# Patient Record
Sex: Female | Born: 1960 | Race: White | Hispanic: No | Marital: Married | Smoking: Former smoker
Health system: Southern US, Community
[De-identification: ages and names within clinical notes are randomized; demographics above are authoritative.]

## PROBLEM LIST (undated history)

## (undated) DIAGNOSIS — E785 Hyperlipidemia, unspecified: Secondary | ICD-10-CM

## (undated) DIAGNOSIS — F419 Anxiety disorder, unspecified: Secondary | ICD-10-CM

## (undated) DIAGNOSIS — R7303 Prediabetes: Secondary | ICD-10-CM

## (undated) DIAGNOSIS — M8580 Other specified disorders of bone density and structure, unspecified site: Secondary | ICD-10-CM

## (undated) DIAGNOSIS — C801 Malignant (primary) neoplasm, unspecified: Secondary | ICD-10-CM

## (undated) DIAGNOSIS — T7840XA Allergy, unspecified, initial encounter: Secondary | ICD-10-CM

## (undated) HISTORY — PX: SPLENECTOMY, TOTAL: SHX788

## (undated) HISTORY — DX: Allergy, unspecified, initial encounter: T78.40XA

## (undated) HISTORY — DX: Anxiety disorder, unspecified: F41.9

## (undated) HISTORY — PX: CHOLECYSTECTOMY: SHX55

## (undated) HISTORY — DX: Hyperlipidemia, unspecified: E78.5

## (undated) HISTORY — DX: Malignant (primary) neoplasm, unspecified: C80.1

## (undated) HISTORY — DX: Other specified disorders of bone density and structure, unspecified site: M85.80

---

## 1989-08-19 HISTORY — PX: ABDOMINAL HYSTERECTOMY: SHX81

## 1995-08-20 HISTORY — PX: OTHER SURGICAL HISTORY: SHX169

## 2006-03-31 ENCOUNTER — Ambulatory Visit: Payer: Self-pay | Admitting: Family Medicine

## 2011-06-19 ENCOUNTER — Emergency Department: Payer: Self-pay | Admitting: Emergency Medicine

## 2012-04-03 DIAGNOSIS — Q8901 Asplenia (congenital): Secondary | ICD-10-CM | POA: Insufficient documentation

## 2012-04-03 DIAGNOSIS — K635 Polyp of colon: Secondary | ICD-10-CM | POA: Insufficient documentation

## 2014-03-16 DIAGNOSIS — D473 Essential (hemorrhagic) thrombocythemia: Secondary | ICD-10-CM | POA: Insufficient documentation

## 2014-03-16 DIAGNOSIS — D75839 Thrombocytosis, unspecified: Secondary | ICD-10-CM | POA: Insufficient documentation

## 2014-10-20 ENCOUNTER — Ambulatory Visit: Payer: Self-pay | Admitting: Unknown Physician Specialty

## 2014-10-21 ENCOUNTER — Other Ambulatory Visit: Payer: Self-pay | Admitting: Unknown Physician Specialty

## 2014-10-21 DIAGNOSIS — R1032 Left lower quadrant pain: Secondary | ICD-10-CM | POA: Insufficient documentation

## 2016-03-28 DIAGNOSIS — D473 Essential (hemorrhagic) thrombocythemia: Secondary | ICD-10-CM | POA: Diagnosis not present

## 2016-03-28 DIAGNOSIS — E7800 Pure hypercholesterolemia, unspecified: Secondary | ICD-10-CM | POA: Diagnosis not present

## 2016-03-28 DIAGNOSIS — R739 Hyperglycemia, unspecified: Secondary | ICD-10-CM | POA: Diagnosis not present

## 2016-04-04 DIAGNOSIS — D473 Essential (hemorrhagic) thrombocythemia: Secondary | ICD-10-CM | POA: Diagnosis not present

## 2016-04-04 DIAGNOSIS — Z9081 Acquired absence of spleen: Secondary | ICD-10-CM | POA: Diagnosis not present

## 2016-04-04 DIAGNOSIS — E7800 Pure hypercholesterolemia, unspecified: Secondary | ICD-10-CM | POA: Diagnosis not present

## 2016-04-04 DIAGNOSIS — F410 Panic disorder [episodic paroxysmal anxiety] without agoraphobia: Secondary | ICD-10-CM | POA: Diagnosis not present

## 2016-10-01 DIAGNOSIS — R739 Hyperglycemia, unspecified: Secondary | ICD-10-CM | POA: Diagnosis not present

## 2016-10-01 DIAGNOSIS — R7989 Other specified abnormal findings of blood chemistry: Secondary | ICD-10-CM | POA: Diagnosis not present

## 2016-10-14 ENCOUNTER — Other Ambulatory Visit: Payer: Self-pay | Admitting: Nurse Practitioner

## 2016-10-14 DIAGNOSIS — R1032 Left lower quadrant pain: Secondary | ICD-10-CM

## 2016-10-14 DIAGNOSIS — G8929 Other chronic pain: Secondary | ICD-10-CM | POA: Diagnosis not present

## 2016-10-14 DIAGNOSIS — R109 Unspecified abdominal pain: Secondary | ICD-10-CM

## 2016-10-17 ENCOUNTER — Ambulatory Visit: Payer: Self-pay

## 2016-10-21 DIAGNOSIS — J9801 Acute bronchospasm: Secondary | ICD-10-CM | POA: Diagnosis not present

## 2016-10-21 DIAGNOSIS — R05 Cough: Secondary | ICD-10-CM | POA: Diagnosis not present

## 2016-10-23 ENCOUNTER — Ambulatory Visit: Payer: Self-pay

## 2016-10-29 ENCOUNTER — Encounter: Payer: Self-pay | Admitting: Radiology

## 2016-10-29 ENCOUNTER — Ambulatory Visit
Admission: RE | Admit: 2016-10-29 | Discharge: 2016-10-29 | Disposition: A | Discharge status: Home / Self Care | Payer: BLUE CROSS/BLUE SHIELD | Source: Ambulatory Visit | Attending: Nurse Practitioner | Admitting: Nurse Practitioner

## 2016-10-29 DIAGNOSIS — R1032 Left lower quadrant pain: Secondary | ICD-10-CM | POA: Insufficient documentation

## 2016-10-29 DIAGNOSIS — G8929 Other chronic pain: Secondary | ICD-10-CM

## 2016-10-29 DIAGNOSIS — R590 Localized enlarged lymph nodes: Secondary | ICD-10-CM | POA: Diagnosis not present

## 2016-10-29 DIAGNOSIS — R109 Unspecified abdominal pain: Secondary | ICD-10-CM

## 2016-10-29 MED ORDER — IOPAMIDOL (ISOVUE-300) INJECTION 61%
100.0000 mL | Freq: Once | INTRAVENOUS | Status: AC | PRN
  Administered 2016-10-29: 100 mL via INTRAVENOUS

## 2016-11-05 DIAGNOSIS — E7800 Pure hypercholesterolemia, unspecified: Secondary | ICD-10-CM | POA: Diagnosis not present

## 2016-11-05 DIAGNOSIS — D473 Essential (hemorrhagic) thrombocythemia: Secondary | ICD-10-CM | POA: Diagnosis not present

## 2016-11-05 DIAGNOSIS — Z1231 Encounter for screening mammogram for malignant neoplasm of breast: Secondary | ICD-10-CM | POA: Diagnosis not present

## 2016-11-05 DIAGNOSIS — Z0000 Encounter for general adult medical examination without abnormal findings: Secondary | ICD-10-CM | POA: Diagnosis not present

## 2016-11-05 DIAGNOSIS — Z720 Tobacco use: Secondary | ICD-10-CM | POA: Insufficient documentation

## 2016-11-08 ENCOUNTER — Other Ambulatory Visit: Payer: Self-pay | Admitting: Family Medicine

## 2016-11-08 DIAGNOSIS — M8580 Other specified disorders of bone density and structure, unspecified site: Secondary | ICD-10-CM

## 2016-11-13 DIAGNOSIS — G8929 Other chronic pain: Secondary | ICD-10-CM | POA: Diagnosis not present

## 2016-11-13 DIAGNOSIS — R1032 Left lower quadrant pain: Secondary | ICD-10-CM | POA: Diagnosis not present

## 2016-11-27 DIAGNOSIS — Z1231 Encounter for screening mammogram for malignant neoplasm of breast: Secondary | ICD-10-CM | POA: Diagnosis not present

## 2016-12-11 DIAGNOSIS — R920 Mammographic microcalcification found on diagnostic imaging of breast: Secondary | ICD-10-CM | POA: Diagnosis not present

## 2016-12-11 DIAGNOSIS — R928 Other abnormal and inconclusive findings on diagnostic imaging of breast: Secondary | ICD-10-CM | POA: Diagnosis not present

## 2017-01-16 DIAGNOSIS — R1032 Left lower quadrant pain: Secondary | ICD-10-CM | POA: Diagnosis not present

## 2017-03-27 ENCOUNTER — Encounter: Payer: Self-pay | Admitting: Nurse Practitioner

## 2017-03-27 ENCOUNTER — Ambulatory Visit (INDEPENDENT_AMBULATORY_CARE_PROVIDER_SITE_OTHER): Payer: BLUE CROSS/BLUE SHIELD | Admitting: Nurse Practitioner

## 2017-03-27 VITALS — BP 104/57 | HR 70 | Temp 98.5°F | Ht 68.0 in | Wt 187.6 lb

## 2017-03-27 DIAGNOSIS — Z716 Tobacco abuse counseling: Secondary | ICD-10-CM | POA: Diagnosis not present

## 2017-03-27 DIAGNOSIS — E785 Hyperlipidemia, unspecified: Secondary | ICD-10-CM | POA: Diagnosis not present

## 2017-03-27 DIAGNOSIS — F410 Panic disorder [episodic paroxysmal anxiety] without agoraphobia: Secondary | ICD-10-CM | POA: Diagnosis not present

## 2017-03-27 DIAGNOSIS — J3089 Other allergic rhinitis: Secondary | ICD-10-CM | POA: Insufficient documentation

## 2017-03-27 DIAGNOSIS — F17200 Nicotine dependence, unspecified, uncomplicated: Secondary | ICD-10-CM

## 2017-03-27 DIAGNOSIS — M8580 Other specified disorders of bone density and structure, unspecified site: Secondary | ICD-10-CM

## 2017-03-27 DIAGNOSIS — Z720 Tobacco use: Secondary | ICD-10-CM | POA: Diagnosis not present

## 2017-03-27 DIAGNOSIS — M810 Age-related osteoporosis without current pathological fracture: Secondary | ICD-10-CM

## 2017-03-27 DIAGNOSIS — Z13820 Encounter for screening for osteoporosis: Secondary | ICD-10-CM

## 2017-03-27 DIAGNOSIS — Z9109 Other allergy status, other than to drugs and biological substances: Secondary | ICD-10-CM

## 2017-03-27 HISTORY — DX: Other specified disorders of bone density and structure, unspecified site: M85.80

## 2017-03-27 MED ORDER — CETIRIZINE HCL 10 MG PO TABS: 10.0000 mg | ORAL_TABLET | Freq: Every day | ORAL | 11 refills | Status: DC

## 2017-03-27 MED ORDER — FLUTICASONE PROPIONATE 50 MCG/ACT NA SUSP: 2.0000 | Freq: Every day | NASAL | 11 refills | Status: DC

## 2017-03-27 MED ORDER — ATORVASTATIN CALCIUM 20 MG PO TABS: 20.0000 mg | ORAL_TABLET | Freq: Every day | ORAL | 11 refills | Status: DC

## 2017-03-27 MED ORDER — SERTRALINE HCL 50 MG PO TABS: 50.0000 mg | ORAL_TABLET | Freq: Every day | ORAL | 11 refills | Status: DC

## 2017-03-27 MED ORDER — MONTELUKAST SODIUM 10 MG PO TABS: 10.0000 mg | ORAL_TABLET | Freq: Every day | ORAL | 11 refills | Status: DC

## 2017-03-27 NOTE — Patient Instructions (Addendum)
Kelsey Key, Thank you for coming in to clinic today.  1. You have several stable medical conditions.  YOur medications have been refilled.  Unless you have changes in symptoms, we will see each other in 1 year.  2. Your annual physical on or after 11/05/2017  3. When you are ready to stop smoking, let me know.  4. You have osteopenia.  This means you do have some small loss of bone density associated with being postmenopausal.  This does not require treatment with medications at this time, but there are some things you should do to prevent further breakdown of bone. If you have progression of osteopenia to osteoporosis, we would recommend medications.  We will repeat your bone density scan now to assess how quickly you are losing bone density.  - Weight bearing exercise: use weights for arm exercises and regular exercise like walking is recommended.  Activating your muscles creates tension on your bones and promotes bone health. - START taking calcium 500-600 mg with vitamin D supplement 2-3 times daily with meals.  If you are eating dairy, only take the supplement two times daily at your meals with least dairy consumption.  Aim for a total of 1500 mg calcium daily with supplements and dietary sources.  Your body only absorbs 500-600 mg each time you take calcium, so make sure you separate your doses.   Please schedule a follow-up appointment with Cassell Smiles, AGNP. Return in about 7 months (around 11/05/2017) for annual physical and in 1 year for hyperlipidemia, anxiety, and seasonal allergies.  If you have any other questions or concerns, please feel free to call the clinic or send a message through [place]. You may also schedule an earlier appointment if necessary.  You will receive a survey after today's visit either digitally by e-mail or paper by [employer]. Your experiences and feedback matter to [place].  Please respond so we know how we are doing as we provide care for you.   Cassell Smiles,  DNP, AGNP-BC Adult Gerontology Nurse Practitioner [place]

## 2017-03-27 NOTE — Progress Notes (Signed)
Subjective:    Patient ID: Kelsey Key, female    DOB: Sep 10, 1960, 56 y.o.   MRN: [id number]  Kelsey Key is a 56 y.o. female presenting on 03/27/2017 for [facility] [place] Provider Pt last seen by PCP Dr. Astrid Divine at [facility] about 6 months ago.  Obtain records from [place].   Pt also needs medicines refilled for her hyperlipidemia, allergies, anxiety.  Hyperlipidemia Pt eats regular diet, avoids fried foods.   Is taking atorvastatin and fish oil 2,000 mg once daily.  Has had lipid panel checked about 6 months ago w/ LDL and total cholesterol at goal.  Environmental and Seasonal Allergies Pt takes Zyrtec, Singulair, Flonase year-round and still has periods of worsening congestion.  Spring and Summer are worst seasons. Ragweed pollens are significant trigger.  Last sinus infection was in April.  She is currently having postnasal drip and "nasty tasting drainage." has already started using a neti pot and saline spray.  She denies any fevers, body aches.  Is current smoker of 1/2-1 ppd.  Tobacco Use/ Smoking Cessation Pt wants to stop smoking, but does not feel ready to stop smoking.  She notes she has been able to quit in past using Wellbutrin w/ nicotine patches.  She was successful at remaining quit for 1.5 years.  Notes stress and anxiety are triggers to resume smoking.  Unsuccessful with patch alone, Chantix (vivid dreams).  Would consider stopping again in future, but not ready now.  GAD/Panic disorder Has recently had abdominal pain w/ negative workup.  Was encouraged to resume her sertraline at 50 mg once daily in April.  Had previously only taken 25 mg once daily.  Notes no change in abdominal pain or moods.   Pt also has prescription for Xanax 12.5 mg tablet last filled in 2016.  Takes only 1/2 tablet each time and has only taken 10 doses since last fill.  Has only had 1-2 major panic attacks in last year, but has days w/ less intense panic.   Pt uses Xanax on those days especially if she has trouble sleeping.  Osteopenia Pt w/ early menopause 2/2 complete hysterectomy. Last DEXA scan performed at [facility] on [address] more than 5 years ago.  She has not been taking calcium or vitamin D supplements. She gets occasional exercise w/ active lifestyle outside of work w/ gardening, housework, and occasional walking.    Past Medical History:  Diagnosis Date  . Anxiety   . Hyperlipidemia    Past Surgical History:  Procedure Laterality Date  . ABDOMINAL HYSTERECTOMY    . CHOLECYSTECTOMY    . SPLENECTOMY, TOTAL     Social History   Social History  . Marital status: Married    Spouse name: N/A  . Number of children: N/A  . Years of education: N/A   Occupational History  . Not on file.   Social History Main Topics  . Smoking status: Current Every Day Smoker    Packs/day: 1.00    Years: 30.00    Types: Cigarettes  . Smokeless tobacco: Never Used  . Alcohol use Not on file  . Drug use: No  . Sexual activity: Not on file   Other Topics Concern  . Not on file   Social History Narrative  . No narrative on file   Family History  Problem Relation Age of Onset  . Diabetes Mother   . Diabetes Father   . Hypertension Brother   .  Throat cancer Maternal Grandmother   . Healthy Brother   . Heart attack Neg Hx   . Stroke Neg Hx   . Breast cancer Neg Hx   . Colon cancer Neg Hx   . Ovarian cancer Neg Hx    No current outpatient prescriptions on file prior to visit.   No current facility-administered medications on file prior to visit.     Review of Systems  Constitutional: Negative.   HENT: Positive for sinus pain and sinus pressure.   Eyes: Negative.   Respiratory: Negative.   Cardiovascular: Negative.   Gastrointestinal: Positive for abdominal pain. Negative for constipation and diarrhea.  Endocrine: Negative.   Genitourinary: Negative.   Musculoskeletal: Negative.   Skin: Negative.     Allergic/Immunologic: Positive for environmental allergies.  Neurological: Negative.   Hematological: Negative.   Psychiatric/Behavioral: Positive for sleep disturbance. Negative for suicidal ideas. The patient is nervous/anxious.    Per HPI unless specifically indicated above     Objective:    BP (!) 104/57 (BP Location: Right Arm, Patient Position: Sitting, Cuff Size: Normal)   Pulse 70   Temp 98.5 F (36.9 C) (Oral)   Ht 5\' 8"  (1.727 m)   Wt 187 lb 9.6 oz (85.1 kg)   SpO2 97%   BMI 28.52 kg/m   Wt Readings from Last 3 Encounters:  03/27/17 187 lb 9.6 oz (85.1 kg)    Physical Exam  Constitutional: She is oriented to person, place, and time. She appears well-developed and well-nourished. No distress.  HENT:  Head: Normocephalic and atraumatic.  Right Ear: Hearing, tympanic membrane, external ear and ear canal normal.  Left Ear: Hearing, tympanic membrane, external ear and ear canal normal.  Nose: Right sinus exhibits maxillary sinus tenderness and frontal sinus tenderness. Left sinus exhibits maxillary sinus tenderness and frontal sinus tenderness.  Mouth/Throat: Uvula is midline, oropharynx is clear and moist and mucous membranes are normal.  Eyes: Pupils are equal, round, and reactive to light. Conjunctivae and EOM are normal. Right eye exhibits no discharge. Left eye exhibits no discharge.  Neck: Normal range of motion. Neck supple. No JVD present. Carotid bruit is not present.  Cardiovascular: Normal rate, regular rhythm and normal heart sounds.   Pulmonary/Chest: Effort normal and breath sounds normal. No respiratory distress.  Abdominal: Soft. Bowel sounds are normal. She exhibits no distension and no mass. There is no tenderness.  Lymphadenopathy:    She has no cervical adenopathy.  Neurological: She is alert and oriented to person, place, and time.  Skin: Skin is warm and dry.  Psychiatric: She has a normal mood and affect. Her behavior is normal. Judgment and  thought content normal.  Vitals reviewed.     Assessment & Plan:   Problem List Items Addressed This Visit      Musculoskeletal and Integument   Osteopenia    Last bone density in 03/2014 indicates T - 1.9 for spine.  Pt has not been taking any calcium supplements or doing other things to prevent progression.  Pt w/ early menopause w/ complete hysterectomy in 1991.  Plan: 1. Repeat DEXA to evaluate progress.  Pt given external order requisition to return to last location where DEXA was performed [facility] imaging center). 2. Start taking calcium supplementation 500 mg w/ vitamin D 2-3 times daily for total calcium intake from supplement and dietary sources of 1500 mg daily. 3. Follow up as needed after results.        Other   Hyperlipidemia - Primary  Pt stable at last labs about 6 months ago w/ LDL and total cholesterol at goal.  Pt on atorvastatin 20 mg once daily.  Plan: 1. Encouraged heart healthy diet. 2. Continue atorvastatin 20 mg once daily. 3. Repeat labs for lipid at next annual physical. 4. Follow up 6 months.      Relevant Medications   atorvastatin (LIPITOR) 20 MG tablet   Environmental and seasonal allergies    Currently w/ acute exacerbation w/ increased sinus tenderness and pressure.  Pt taking year-round Zyrtec, Singulair, and Flonase without symptoms of epistaxis.  Has recently begun using netipot to assist w/ increased symptoms.  Currently afebrile w/o s/sx of active viral/bacterial infection.  Plan: 1. Encouraged adequate water hydration.  Use guaifenesin as needed OTC. 2. Continue current medication regimen. 3. If becomes febrile in next 5-7 days, call clinic.  May consider antibiotic therapy. 4. Follow up as needed.      Relevant Medications   cetirizine (ZYRTEC) 10 MG tablet   fluticasone (FLONASE) 50 MCG/ACT nasal spray   montelukast (SINGULAIR) 10 MG tablet   Panic disorder    Currently stable.  Panic is situational and pt often avoids these  situations or prepares herself for the stress prior to encountering them.   Plan: 1. Continue sertraline 50 mg once daily. 2. Consider augmenting therapy if panic attacks increase in frequency. 3. Follow up 6 months.      Relevant Medications   sertraline (ZOLOFT) 50 MG tablet    Other Visit Diagnoses    Postmenopausal bone loss     See osteopenia above.   Relevant Orders   DG Bone Density   Osteoporosis screening     See osteopenia above.   Relevant Orders   DG Bone Density   Has been smoking tobacco for 30 years or more     Pt at increased risk for osteoporosis and lung cancer w/ smoking history.  Pt currently declines to start smoking cessation.  Uses tobacco when stressed and doesn't see stress resolving soon.  Plan: 1. Encouraged cessation.  Pt will contact if becomes ready to quit prior to next appointment.    Encounter for smoking cessation counseling     Discussion today >10 minutes specifically on counseling on risks of tobacco use, complications, treatment, smoking cessation including risks of cancer, osteoporosis, managing stress.       Meds ordered this encounter  Medications  . Multiple Vitamin (MULTIVITAMIN) capsule    Sig: Take by mouth.  Marland Kitchen atorvastatin (LIPITOR) 20 MG tablet    Sig: Take 1 tablet (20 mg total) by mouth daily.    Dispense:  30 tablet    Refill:  11    Order Specific Question:   Supervising Provider    Answer:   Olin Hauser [[id number]]  . cetirizine (ZYRTEC) 10 MG tablet    Sig: Take 1 tablet (10 mg total) by mouth daily.    Dispense:  30 tablet    Refill:  11    Order Specific Question:   Supervising Provider    Answer:   Olin Hauser [[id number]]  . fluticasone (FLONASE) 50 MCG/ACT nasal spray    Sig: Place 2 sprays into both nostrils daily.    Dispense:  16 g    Refill:  11    Order Specific Question:   Supervising Provider    Answer:   Olin Hauser [[id number]]  . montelukast (SINGULAIR) 10 MG tablet    Sig:  Take 1 tablet (10 mg  total) by mouth at bedtime.    Dispense:  30 tablet    Refill:  11    Order Specific Question:   Supervising Provider    Answer:   Olin Hauser [[id number]]  . sertraline (ZOLOFT) 50 MG tablet    Sig: Take 1 tablet (50 mg total) by mouth daily.    Dispense:  30 tablet    Refill:  11    Order Specific Question:   Supervising Provider    Answer:   Olin Hauser [[id number]]      Follow up plan: Return in about 7 months (around 11/05/2017) for annual physical and in 1 year for hyperlipidemia, anxiety, and seasonal allergies.  Cassell Smiles, DNP, AGPCNP-BC Adult Gerontology Primary Care Nurse Practitioner [facility] 03/27/2017, 4:49 PM

## 2017-03-28 NOTE — Assessment & Plan Note (Signed)
Currently stable.  Panic is situational and pt often avoids these situations or prepares herself for the stress prior to encountering them.   Plan: 1. Continue sertraline 50 mg once daily. 2. Consider augmenting therapy if panic attacks increase in frequency. 3. Follow up 6 months.

## 2017-03-28 NOTE — Progress Notes (Signed)
I have reviewed this encounter including the documentation in this note and/or discussed this patient with the provider, Cassell Smiles, AGPCNP-BC. I am certifying that I agree with the content of this note as supervising physician.  Nobie Putnam, [facility] 03/28/2017, 12:29 PM

## 2017-03-28 NOTE — Assessment & Plan Note (Signed)
Currently w/ acute exacerbation w/ increased sinus tenderness and pressure.  Pt taking year-round Zyrtec, Singulair, and Flonase without symptoms of epistaxis.  Has recently begun using netipot to assist w/ increased symptoms.  Currently afebrile w/o s/sx of active viral/bacterial infection.  Plan: 1. Encouraged adequate water hydration.  Use guaifenesin as needed OTC. 2. Continue current medication regimen. 3. If becomes febrile in next 5-7 days, call clinic.  May consider antibiotic therapy. 4. Follow up as needed.

## 2017-03-28 NOTE — Assessment & Plan Note (Signed)
Pt stable at last labs about 6 months ago w/ LDL and total cholesterol at goal.  Pt on atorvastatin 20 mg once daily.  Plan: 1. Encouraged heart healthy diet. 2. Continue atorvastatin 20 mg once daily. 3. Repeat labs for lipid at next annual physical. 4. Follow up 6 months.

## 2017-03-28 NOTE — Assessment & Plan Note (Signed)
Last bone density in 03/2014 indicates T - 1.9 for spine.  Pt has not been taking any calcium supplements or doing other things to prevent progression.  Pt w/ early menopause w/ complete hysterectomy in 1991.  Plan: 1. Repeat DEXA to evaluate progress.  Pt given external order requisition to return to last location where DEXA was performed [facility] imaging center). 2. Start taking calcium supplementation 500 mg w/ vitamin D 2-3 times daily for total calcium intake from supplement and dietary sources of 1500 mg daily. 3. Follow up as needed after results.

## 2017-03-31 ENCOUNTER — Encounter: Payer: Self-pay | Admitting: Nurse Practitioner

## 2017-03-31 MED ORDER — AMOXICILLIN 500 MG PO TABS: 500.0000 mg | ORAL_TABLET | Freq: Two times a day (BID) | ORAL | 0 refills | Status: AC

## 2017-05-13 DIAGNOSIS — Z780 Asymptomatic menopausal state: Secondary | ICD-10-CM | POA: Diagnosis not present

## 2017-05-13 DIAGNOSIS — M8580 Other specified disorders of bone density and structure, unspecified site: Secondary | ICD-10-CM | POA: Diagnosis not present

## 2017-05-13 LAB — HM DEXA SCAN: HM Dexa Scan: NORMAL

## 2017-05-15 ENCOUNTER — Telehealth: Payer: Self-pay | Admitting: Nurse Practitioner

## 2017-05-15 ENCOUNTER — Other Ambulatory Visit: Payer: Self-pay

## 2017-05-15 NOTE — Telephone Encounter (Signed)
Received results of DEXA scan. Bone density 03/2017: Spine Z 0.6 T -0.6  (Increase 16.8%) Left femur Z 0.8 T 0  (Increase 1.2%)   Please call pt to tell her she has had improvement in bone density of her spine and stable bone denisty in her left femur.  Encourage continued supplementation of calcium and vitamin D and weight bearing exercise.    - Weight bearing exercise: use weights for arm exercises and regular exercise like walking is recommended.  Activating your muscles creates tension on your bones and promotes bone health.  - START taking calcium 500-600 mg with vitamin D supplement 2-3 times daily with meals.  If you are eating dairy, only take the supplement two times daily at your meals with least dairy consumption.  Aim for a total of 1500 mg calcium daily with supplements and dietary sources.  Your body only absorbs 500-600 mg each time you take calcium, so make sure you separate your doses.

## 2017-05-15 NOTE — Telephone Encounter (Signed)
The pt was notified. No questions or concerns. 

## 2017-06-18 ENCOUNTER — Ambulatory Visit (INDEPENDENT_AMBULATORY_CARE_PROVIDER_SITE_OTHER): Payer: BLUE CROSS/BLUE SHIELD

## 2017-06-18 DIAGNOSIS — Z23 Encounter for immunization: Secondary | ICD-10-CM

## 2017-09-18 DIAGNOSIS — M461 Sacroiliitis, not elsewhere classified: Secondary | ICD-10-CM | POA: Diagnosis not present

## 2017-09-18 DIAGNOSIS — M545 Low back pain: Secondary | ICD-10-CM | POA: Diagnosis not present

## 2017-09-18 DIAGNOSIS — M9904 Segmental and somatic dysfunction of sacral region: Secondary | ICD-10-CM | POA: Diagnosis not present

## 2017-09-18 DIAGNOSIS — M9903 Segmental and somatic dysfunction of lumbar region: Secondary | ICD-10-CM | POA: Diagnosis not present

## 2017-09-19 DIAGNOSIS — M9904 Segmental and somatic dysfunction of sacral region: Secondary | ICD-10-CM | POA: Diagnosis not present

## 2017-09-19 DIAGNOSIS — M9903 Segmental and somatic dysfunction of lumbar region: Secondary | ICD-10-CM | POA: Diagnosis not present

## 2017-09-19 DIAGNOSIS — M461 Sacroiliitis, not elsewhere classified: Secondary | ICD-10-CM | POA: Diagnosis not present

## 2017-09-19 DIAGNOSIS — M545 Low back pain: Secondary | ICD-10-CM | POA: Diagnosis not present

## 2017-09-23 DIAGNOSIS — M545 Low back pain: Secondary | ICD-10-CM | POA: Diagnosis not present

## 2017-09-23 DIAGNOSIS — M9904 Segmental and somatic dysfunction of sacral region: Secondary | ICD-10-CM | POA: Diagnosis not present

## 2017-09-23 DIAGNOSIS — M9903 Segmental and somatic dysfunction of lumbar region: Secondary | ICD-10-CM | POA: Diagnosis not present

## 2017-09-23 DIAGNOSIS — M461 Sacroiliitis, not elsewhere classified: Secondary | ICD-10-CM | POA: Diagnosis not present

## 2017-09-25 DIAGNOSIS — M9903 Segmental and somatic dysfunction of lumbar region: Secondary | ICD-10-CM | POA: Diagnosis not present

## 2017-09-25 DIAGNOSIS — M545 Low back pain: Secondary | ICD-10-CM | POA: Diagnosis not present

## 2017-09-25 DIAGNOSIS — M9904 Segmental and somatic dysfunction of sacral region: Secondary | ICD-10-CM | POA: Diagnosis not present

## 2017-09-25 DIAGNOSIS — M461 Sacroiliitis, not elsewhere classified: Secondary | ICD-10-CM | POA: Diagnosis not present

## 2017-12-24 ENCOUNTER — Other Ambulatory Visit: Payer: Self-pay

## 2017-12-24 DIAGNOSIS — Z9109 Other allergy status, other than to drugs and biological substances: Secondary | ICD-10-CM

## 2017-12-24 MED ORDER — MONTELUKAST SODIUM 10 MG PO TABS: 10.0000 mg | ORAL_TABLET | Freq: Every day | ORAL | 0 refills | Status: DC

## 2017-12-24 NOTE — Telephone Encounter (Signed)
Refill sent to [facility]

## 2017-12-30 ENCOUNTER — Other Ambulatory Visit: Payer: Self-pay

## 2017-12-30 DIAGNOSIS — E785 Hyperlipidemia, unspecified: Secondary | ICD-10-CM

## 2017-12-30 DIAGNOSIS — Z9109 Other allergy status, other than to drugs and biological substances: Secondary | ICD-10-CM

## 2017-12-30 MED ORDER — CETIRIZINE HCL 10 MG PO TABS: 10.0000 mg | ORAL_TABLET | Freq: Every day | ORAL | 0 refills | Status: DC

## 2017-12-30 MED ORDER — ATORVASTATIN CALCIUM 20 MG PO TABS: 20.0000 mg | ORAL_TABLET | Freq: Every day | ORAL | 0 refills | Status: DC

## 2018-02-23 ENCOUNTER — Other Ambulatory Visit: Payer: Self-pay | Admitting: Nurse Practitioner

## 2018-02-23 DIAGNOSIS — F410 Panic disorder [episodic paroxysmal anxiety] without agoraphobia: Secondary | ICD-10-CM

## 2018-02-25 ENCOUNTER — Telehealth: Payer: Self-pay | Admitting: Nurse Practitioner

## 2018-02-25 NOTE — Telephone Encounter (Signed)
Pt will be in Aug 1st for a follow up for refills.  She asked if she needed to do labs prior to appt [phone]

## 2018-02-25 NOTE — Telephone Encounter (Signed)
Patient advised to be seen first the last visit was 03/2017.

## 2018-02-26 ENCOUNTER — Other Ambulatory Visit: Payer: Self-pay | Admitting: Nurse Practitioner

## 2018-02-26 DIAGNOSIS — F410 Panic disorder [episodic paroxysmal anxiety] without agoraphobia: Secondary | ICD-10-CM

## 2018-03-10 ENCOUNTER — Encounter: Payer: Self-pay | Admitting: Nurse Practitioner

## 2018-03-10 ENCOUNTER — Ambulatory Visit (INDEPENDENT_AMBULATORY_CARE_PROVIDER_SITE_OTHER): Payer: BLUE CROSS/BLUE SHIELD | Admitting: Nurse Practitioner

## 2018-03-10 ENCOUNTER — Other Ambulatory Visit: Payer: Self-pay

## 2018-03-10 VITALS — BP 118/57 | HR 81 | Temp 98.5°F | Ht 68.0 in | Wt 188.2 lb

## 2018-03-10 DIAGNOSIS — L237 Allergic contact dermatitis due to plants, except food: Secondary | ICD-10-CM | POA: Diagnosis not present

## 2018-03-10 MED ORDER — TRIAMCINOLONE ACETONIDE 0.1 % EX CREA: 1.0000 "application " | TOPICAL_CREAM | Freq: Two times a day (BID) | CUTANEOUS | 0 refills | Status: AC

## 2018-03-10 MED ORDER — PREDNISONE 10 MG PO TABS: ORAL_TABLET | ORAL | 0 refills | Status: DC

## 2018-03-10 NOTE — Patient Instructions (Addendum)
Kelsey Key,   Thank you for coming in to clinic today.  1. Start triamcinolone ointment 0.1% twice daily for 14 days on your arms and legs. Use for no more than 7 days on your face.  2. Take prednisone taper 10 mg tablets Day 1 (Today): Take 6 pills at one time Day 2: Take 6 pills  Day 3: Take 5 pills Day 4: Take 4 pills Day 5: Take 3 pills Day 6: Take 2 pills Day 7: Take 1 pill then stop.   3. Watch for cellulitis - spreading red rash, weeping of liquid from your skin, or any white/cloudy pink drainage from your wounds.  Please schedule a follow-up appointment with Cassell Smiles, AGNP. Return 5-7 days if symptoms worsen or fail to improve.  If you have any other questions or concerns, please feel free to call the clinic or send a message through [place]. You may also schedule an earlier appointment if necessary.  You will receive a survey after today's visit either digitally by e-mail or paper by [employer]. Your experiences and feedback matter to [place].  Please respond so we know how we are doing as we provide care for you.   Cassell Smiles, DNP, AGNP-BC Adult Gerontology Nurse Practitioner [place]

## 2018-03-10 NOTE — Progress Notes (Signed)
Subjective:    Patient ID: Kelsey Key, female    DOB: 1961/03/06, 57 y.o.   MRN: [id number]  Kelsey Key is a 57 y.o. 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  Possible poison ivy/oak/sumac exposure.  Also possible for this to be insects or reaction to pesticide in [place]. - Pustular rash began about 4-5 days ago and continues to spread.  Now on R leg, bilateral forearms, and face.  It is intensely pruritic.  Patient is awakening at night 2/2 symptoms.  Has been using cortisone 10 with mild relief. - Patient is concerned for skin infection with asplenia.  Has been using some Neosporin to prevent.  Social History   Tobacco Use  . Smoking status: Current Every Day Smoker    Packs/day: 1.00    Years: 30.00    Pack years: 30.00    Types: Cigarettes  . Smokeless tobacco: Never Used  Substance Use Topics  . Alcohol use: No  . Drug use: No    Review of Systems Per HPI unless specifically indicated above     Objective:    BP (!) 118/57 (BP Location: Left Arm, Patient Position: Sitting, Cuff Size: Normal)   Pulse 81   Temp 98.5 F (36.9 C) (Oral)   Ht 5\' 8"  (1.727 m)   Wt 188 lb 3.2 oz (85.4 kg)   BMI 28.62 kg/m   Wt Readings from Last 3 Encounters:  03/10/18 188 lb 3.2 oz (85.4 kg)  03/27/17 187 lb 9.6 oz (85.1 kg)    Physical Exam  Constitutional: She is oriented to person, place, and time. She appears well-developed and well-nourished. No distress.  HENT:  Head: Normocephalic and atraumatic.  Neurological: She is alert and oriented to person, place, and time.  Skin: Skin is warm and dry. Capillary refill takes less than 2 seconds. Rash (diffuse rash over anterior forearms bilaterally, R lateral leg and thigh, and R cheek - nodular and pustular rash with raised erythematous and edematous lesions.  Several marked with excoriation and  scabs.) noted.  Psychiatric: She has a normal mood and affect. Her behavior is normal. Judgment and thought content normal.  Vitals reviewed.   Results for orders placed or performed in visit on 05/15/17  HM DEXA SCAN  Result Value Ref Range   HM Dexa Scan normal       Assessment & Plan:   Problem List Items Addressed This Visit    None    Visit Diagnoses    Contact dermatitis due to poison ivy    -  Primary   Relevant Medications   predniSONE (DELTASONE) 10 MG tablet   triamcinolone cream (KENALOG) 0.1 %    Consistent with poison ivy contact dermatitis, x 4 days duration. Patient with known h/o poison ivy, similar with previous exposures - Afebrile, no evidence of bacterial superinfection from scratching  Plan: 1. Start prednisone taper over 7 days Day 1-2: 60 mg, Day 3: 50 mg, Day 4: 40 mg; Day 5: 30 mg; Day 6 20 mg; Day 7: 10 mg then stop. 2. Start OTC Benadryl PRN itching, may try topical calamine lotion. - START triamcinolone 0.1% bid x 7-14 days prn itching. 3. Avoid scratching 4. Return criteria given, 1- 2 weeks if worsening or not improving.  Monitor for cellulitis.  Education provided about skin infection  signs and symptoms.    Meds ordered this encounter  Medications  . predniSONE (DELTASONE) 10 MG tablet    Sig: Day 1-2 take 6 pills. Day 3 take 5 pills then reduce by 1 pill each day.    Dispense:  27 tablet    Refill:  0    Order Specific Question:   Supervising Provider    Answer:   Olin Hauser [[id number]]  . triamcinolone cream (KENALOG) 0.1 %    Sig: Apply 1 application topically 2 (two) times daily for 14 days.    Dispense:  28.4 g    Refill:  0    Order Specific Question:   Supervising Provider    Answer:   Olin Hauser [[id number]]    Follow up plan: Return 5-7 days if symptoms worsen or fail to improve.  Cassell Smiles, DNP, AGPCNP-BC Adult Gerontology Primary Care Nurse Practitioner [facility] 03/10/2018, 9:33 AM

## 2018-03-19 ENCOUNTER — Encounter: Payer: Self-pay | Admitting: Nurse Practitioner

## 2018-03-19 ENCOUNTER — Ambulatory Visit (INDEPENDENT_AMBULATORY_CARE_PROVIDER_SITE_OTHER): Payer: BLUE CROSS/BLUE SHIELD | Admitting: Nurse Practitioner

## 2018-03-19 ENCOUNTER — Other Ambulatory Visit: Payer: Self-pay

## 2018-03-19 VITALS — BP 109/52 | HR 76 | Temp 98.4°F | Ht 68.0 in | Wt 187.2 lb

## 2018-03-19 DIAGNOSIS — F410 Panic disorder [episodic paroxysmal anxiety] without agoraphobia: Secondary | ICD-10-CM | POA: Diagnosis not present

## 2018-03-19 DIAGNOSIS — E785 Hyperlipidemia, unspecified: Secondary | ICD-10-CM

## 2018-03-19 DIAGNOSIS — Z130 Encounter for screening for diseases of the blood and blood-forming organs and certain disorders involving the immune mechanism: Secondary | ICD-10-CM

## 2018-03-19 DIAGNOSIS — Z9109 Other allergy status, other than to drugs and biological substances: Secondary | ICD-10-CM

## 2018-03-19 DIAGNOSIS — F418 Other specified anxiety disorders: Secondary | ICD-10-CM | POA: Diagnosis not present

## 2018-03-19 MED ORDER — FLUTICASONE PROPIONATE 50 MCG/ACT NA SUSP: 2.0000 | Freq: Every day | NASAL | 11 refills | Status: DC

## 2018-03-19 MED ORDER — ATORVASTATIN CALCIUM 20 MG PO TABS: 20.0000 mg | ORAL_TABLET | Freq: Every day | ORAL | 0 refills | Status: DC

## 2018-03-19 MED ORDER — ALPRAZOLAM 0.25 MG PO TABS: 0.2500 mg | ORAL_TABLET | Freq: Two times a day (BID) | ORAL | 0 refills | Status: DC | PRN

## 2018-03-19 MED ORDER — SERTRALINE HCL 50 MG PO TABS: 50.0000 mg | ORAL_TABLET | Freq: Every day | ORAL | 1 refills | Status: DC

## 2018-03-19 NOTE — Progress Notes (Signed)
Subjective:    Patient ID: Kelsey Key, female    DOB: Nov 07, 1960, 57 y.o.   MRN: [id number]  Kelsey Key is a 57 y.o. female presenting on 03/19/2018 for Medication Refill and Rash (recently diagnose w/ poison ivy)   HPI Anxiety/panic disorder Patient is currently taking Zoloft 50 mg once daily.  She also takes alprazolam 0.25 mg up to twice daily as needed for anxiety.  She has only used 4 tablets out of 10 tablets over the last 6 months.  PMP aware reviewed and is consistent with patient report.  Currently, patient feels anxiety is well controlled.  Panic attacks occur very infrequently and are usually associated with flying in airplane.  Patient does report difficulty managing day-to-day stress.  Hyperlipidemia Patient continues to take atorvastatin 20 mg once daily with good relief.  She has had some dietary indiscretions over recent weeks.  No new ASCVD events. Pt denies changes in vision, chest tightness/pressure, palpitations, shortness of breath, leg pain while walking, leg or arm weakness, and sudden loss of speech or loss of consciousness.   Rash Poison ivy rash has returned with low severity over last 2-3 days.  Patient had improvement on prednisone.  Majority of the rash has resolved.  Patient continues to use triamcinolone ointment twice daily  Social History   Tobacco Use  . Smoking status: Current Every Day Smoker    Packs/day: 1.00    Years: 30.00    Pack years: 30.00    Types: Cigarettes  . Smokeless tobacco: Never Used  Substance Use Topics  . Alcohol use: No  . Drug use: No    Review of Systems Per HPI unless specifically indicated above     Objective:    BP (!) 109/52 (BP Location: Left Arm, Patient Position: Sitting, Cuff Size: Normal)   Pulse 76   Temp 98.4 F (36.9 C) (Oral)   Ht 5\' 8"  (1.727 m)   Wt 187 lb 3.2 oz (84.9 kg)   BMI 28.46 kg/m   Wt Readings from Last 3 Encounters:  03/19/18 187 lb 3.2 oz (84.9 kg)  03/10/18 188 lb 3.2 oz (85.4  kg)  03/27/17 187 lb 9.6 oz (85.1 kg)    Physical Exam  Constitutional: She is oriented to person, place, and time. She appears well-developed and well-nourished. No distress.  HENT:  Head: Normocephalic and atraumatic.  Neck: Normal range of motion. Neck supple. Carotid bruit is not present.  Cardiovascular: Normal rate, regular rhythm, S1 normal, S2 normal, normal heart sounds and intact distal pulses.  Pulmonary/Chest: Effort normal and breath sounds normal. No respiratory distress.  Abdominal: Soft. Bowel sounds are normal.  Musculoskeletal: She exhibits no edema (pedal).  Neurological: She is alert and oriented to person, place, and time.  Skin: Skin is warm and dry. Capillary refill takes less than 2 seconds.  Diffuse pustular rash over arms and legs significantly improved from last exam  Psychiatric: She has a normal mood and affect. Her behavior is normal. Judgment and thought content normal.  Vitals reviewed.    Results for orders placed or performed in visit on 05/15/17  HM DEXA SCAN  Result Value Ref Range   HM Dexa Scan normal       Assessment & Plan:   Problem List Items Addressed This Visit      Other   Hyperlipidemia    Pt stable at last labs about 6 months ago w/ LDL and total cholesterol at goal.  Pt on atorvastatin 20  mg once daily.  Plan: 1. Encouraged heart healthy diet. 2. Continue atorvastatin 20 mg once daily. 3. Repeat lipid panel today. 4. Follow up 6 months.      Relevant Medications   atorvastatin (LIPITOR) 20 MG tablet   Other Relevant Orders   Lipid panel   TSH   Comprehensive metabolic panel   Panic disorder    Currently stable.  Panic is situational and pt often avoids these situations or prepares herself for the stress prior to encountering them.  Anxiety is not 100% controlled.  Patient often has difficulty self managing with situational anxiety.  Plan: 1. Continue sertraline 50 mg once daily. 2. Consider augmenting therapy if panic  attacks increase in frequency. 3.  Instructed patient on mindfulness techniques.  Consider counseling therapy in future. 4. Follow up 6 months.      Relevant Medications   sertraline (ZOLOFT) 50 MG tablet   ALPRAZolam (XANAX) 0.25 MG tablet   Situational anxiety - Primary    See assessment plan panic disorder      Relevant Medications   sertraline (ZOLOFT) 50 MG tablet   ALPRAZolam (XANAX) 0.25 MG tablet    Other Visit Diagnoses    Environmental allergies       Stable on Flonase.  Refill prescription today.  Follow-up as needed if symptoms worsen.   Relevant Medications   fluticasone (FLONASE) 50 MCG/ACT nasal spray   Screening for deficiency anemia       Patient with past history of deficiency anemia.  Rescreen with labs today.   Relevant Orders   CBC with Differential/Platelet      Meds ordered this encounter  Medications  . sertraline (ZOLOFT) 50 MG tablet    Sig: Take 1 tablet (50 mg total) by mouth daily.    Dispense:  30 tablet    Refill:  1  . atorvastatin (LIPITOR) 20 MG tablet    Sig: Take 1 tablet (20 mg total) by mouth daily.    Dispense:  30 tablet    Refill:  0  . fluticasone (FLONASE) 50 MCG/ACT nasal spray    Sig: Place 2 sprays into both nostrils daily.    Dispense:  16 g    Refill:  11  . ALPRAZolam (XANAX) 0.25 MG tablet    Sig: Take 1 tablet (0.25 mg total) by mouth 2 (two) times daily as needed for anxiety.    Dispense:  10 tablet    Refill:  0    Order Specific Question:   Supervising Provider    Answer:   Olin Hauser [[id number]]    Follow up plan: Return in about 6 months (around 09/19/2018) for Anxiety, cholesterol AND 3 months for physical.  Cassell Smiles, DNP, AGPCNP-BC Adult Gerontology Primary Care Nurse Practitioner [facility] 03/23/2018, 5:55 PM

## 2018-03-19 NOTE — Patient Instructions (Addendum)
Kelsey Key,   Thank you for coming in to clinic today.  1. Continue alprazolam 0.25 mg one tablet as needed for anxiety with flying.  2. You will be due for FASTING BLOOD WORK.  This means you should eat no food or drink after midnight.  Drink only water or coffee without cream/sugar on the morning of your lab visit. - Please go ahead have lab draw at [place] in the next 7 days. - Your results will be available about 2-3 days after blood draw.  If you have set up a MyChart account, you can can log in to MyChart online to view your results and a brief explanation. Also, we can discuss your results together at your next office visit if you would like. Labs at   3. Continue triamcinolone for your poison ivy rash until cleared.  4. Continue Zoloft 50 mg once daily  5. Consider mindfulness for stress management. - FOCUS on what is important right now. - Can I change it? - Can I fix it right now? Or later (maybe write this down on a list or your email/voicemail)? - Is it something that someone else should do? - let go of what you cannot control.  - schedule communication for later if it needs to be addressed.  - 4-7-8 breathing technique: breathe in to count of 4, hold breath for count of 7, exhale for count of 8; do 3-5 times for letting go of overactive thoughts.   Please schedule a follow-up appointment with Cassell Smiles, AGNP. Return in about 6 months (around 09/19/2018) for Anxiety, cholesterol AND 3 months for physical.  If you have any other questions or concerns, please feel free to call the clinic or send a message through [place]. You may also schedule an earlier appointment if necessary.  You will receive a survey after today's visit either digitally by e-mail or paper by [employer]. Your experiences and feedback matter to [place].  Please respond so we know how we are doing as we provide care for you.   Cassell Smiles, DNP, AGNP-BC Adult Gerontology Nurse Practitioner [place]

## 2018-03-22 ENCOUNTER — Encounter: Payer: Self-pay | Admitting: Nurse Practitioner

## 2018-03-23 ENCOUNTER — Encounter: Payer: Self-pay | Admitting: Nurse Practitioner

## 2018-03-23 DIAGNOSIS — F418 Other specified anxiety disorders: Secondary | ICD-10-CM | POA: Insufficient documentation

## 2018-03-23 NOTE — Assessment & Plan Note (Signed)
Currently stable.  Panic is situational and pt often avoids these situations or prepares herself for the stress prior to encountering them.  Anxiety is not 100% controlled.  Patient often has difficulty self managing with situational anxiety.  Plan: 1. Continue sertraline 50 mg once daily. 2. Consider augmenting therapy if panic attacks increase in frequency. 3.  Instructed patient on mindfulness techniques.  Consider counseling therapy in future. 4. Follow up 6 months.

## 2018-03-23 NOTE — Assessment & Plan Note (Signed)
See assessment plan panic disorder

## 2018-03-23 NOTE — Assessment & Plan Note (Signed)
Pt stable at last labs about 6 months ago w/ LDL and total cholesterol at goal.  Pt on atorvastatin 20 mg once daily.  Plan: 1. Encouraged heart healthy diet. 2. Continue atorvastatin 20 mg once daily. 3. Repeat lipid panel today. 4. Follow up 6 months.

## 2018-03-25 ENCOUNTER — Other Ambulatory Visit: Payer: Self-pay

## 2018-03-25 DIAGNOSIS — E785 Hyperlipidemia, unspecified: Secondary | ICD-10-CM | POA: Diagnosis not present

## 2018-03-25 DIAGNOSIS — Z9109 Other allergy status, other than to drugs and biological substances: Secondary | ICD-10-CM

## 2018-03-25 DIAGNOSIS — Z130 Encounter for screening for diseases of the blood and blood-forming organs and certain disorders involving the immune mechanism: Secondary | ICD-10-CM | POA: Diagnosis not present

## 2018-03-25 MED ORDER — MONTELUKAST SODIUM 10 MG PO TABS: 10.0000 mg | ORAL_TABLET | Freq: Every day | ORAL | 0 refills | Status: DC

## 2018-03-25 MED ORDER — CETIRIZINE HCL 10 MG PO TABS: 10.0000 mg | ORAL_TABLET | Freq: Every day | ORAL | 0 refills | Status: DC

## 2018-03-26 ENCOUNTER — Other Ambulatory Visit: Payer: Self-pay | Admitting: Nurse Practitioner

## 2018-03-26 DIAGNOSIS — E785 Hyperlipidemia, unspecified: Secondary | ICD-10-CM

## 2018-03-26 LAB — CBC WITH DIFFERENTIAL/PLATELET
Basophils Absolute: 0.1 10*3/uL (ref 0.0–0.2)
Basos: 1 %
EOS (ABSOLUTE): 0.1 10*3/uL (ref 0.0–0.4)
Eos: 1 %
Hematocrit: 39.7 % (ref 34.0–46.6)
Hemoglobin: 13.6 g/dL (ref 11.1–15.9)
Immature Grans (Abs): 0 10*3/uL (ref 0.0–0.1)
Immature Granulocytes: 0 %
Lymphocytes Absolute: 3.1 10*3/uL (ref 0.7–3.1)
Lymphs: 29 %
MCH: 32 pg (ref 26.6–33.0)
MCHC: 34.3 g/dL (ref 31.5–35.7)
MCV: 93 fL (ref 79–97)
Monocytes Absolute: 0.8 10*3/uL (ref 0.1–0.9)
Monocytes: 8 %
Neutrophils Absolute: 6.8 10*3/uL (ref 1.4–7.0)
Neutrophils: 61 %
Platelets: 411 10*3/uL (ref 150–450)
RBC: 4.25 x10E6/uL (ref 3.77–5.28)
RDW: 13.8 % (ref 12.3–15.4)
WBC: 10.9 10*3/uL — ABNORMAL HIGH (ref 3.4–10.8)

## 2018-03-26 LAB — COMPREHENSIVE METABOLIC PANEL
ALT: 14 IU/L (ref 0–32)
AST: 12 IU/L (ref 0–40)
Albumin/Globulin Ratio: 2.1 (ref 1.2–2.2)
Albumin: 4.2 g/dL (ref 3.5–5.5)
Alkaline Phosphatase: 50 IU/L (ref 39–117)
BUN/Creatinine Ratio: 12 (ref 9–23)
BUN: 11 mg/dL (ref 6–24)
Bilirubin Total: 0.4 mg/dL (ref 0.0–1.2)
CO2: 21 mmol/L (ref 20–29)
Calcium: 9.2 mg/dL (ref 8.7–10.2)
Chloride: 105 mmol/L (ref 96–106)
Creatinine, Ser: 0.9 mg/dL (ref 0.57–1.00)
GFR calc Af Amer: 82 mL/min/{1.73_m2} (ref >59)
GFR calc non Af Amer: 71 mL/min/{1.73_m2} (ref >59)
Globulin, Total: 2 g/dL (ref 1.5–4.5)
Glucose: 103 mg/dL — ABNORMAL HIGH (ref 65–99)
Potassium: 4.4 mmol/L (ref 3.5–5.2)
Sodium: 142 mmol/L (ref 134–144)
Total Protein: 6.2 g/dL (ref 6.0–8.5)

## 2018-03-26 LAB — LIPID PANEL
Chol/HDL Ratio: 3.1 ratio (ref 0.0–4.4)
Cholesterol, Total: 176 mg/dL (ref 100–199)
HDL: 56 mg/dL (ref >39)
LDL Calculated: 103 mg/dL — ABNORMAL HIGH (ref 0–99)
Triglycerides: 83 mg/dL (ref 0–149)
VLDL Cholesterol Cal: 17 mg/dL (ref 5–40)

## 2018-03-26 LAB — TSH: TSH: 2.02 u[IU]/mL (ref 0.450–4.500)

## 2018-03-26 MED ORDER — ATORVASTATIN CALCIUM 40 MG PO TABS: 40.0000 mg | ORAL_TABLET | Freq: Every day | ORAL | 1 refills | Status: DC

## 2018-06-08 IMAGING — CT CT ABD-PELV W/ CM
1 of 3 series · 14 of 32 positions shown, 19 images · IV contrast (APPLIED)
Comparison: 10/20/2014

CLINICAL DATA: Left lower quadrant pain

EXAM:
CT ABDOMEN AND PELVIS WITH CONTRAST
TECHNIQUE: Multidetector CT imaging of the abdomen and pelvis was performed
using the standard protocol following bolus administration of
intravenous contrast.
CONTRAST:  100mL TE8T4S-ISS IOPAMIDOL (TE8T4S-ISS) INJECTION 61%

[Series 2: axial st · axial · 0.90mm/px · z∈[-502,-92]mm · 14 of 94 slices shown, 19 images]
[im 6/94  soft-tissue]
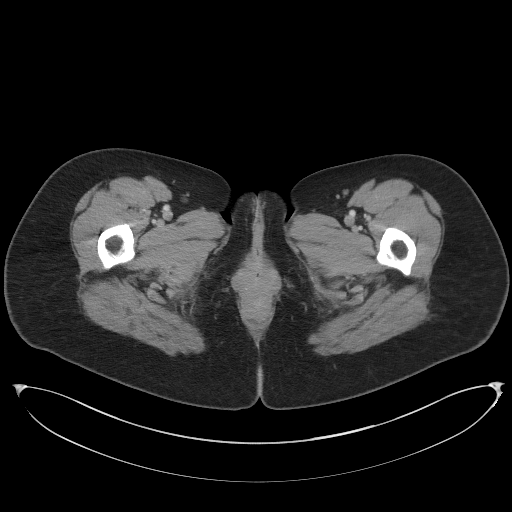
[im 6/94  bone]
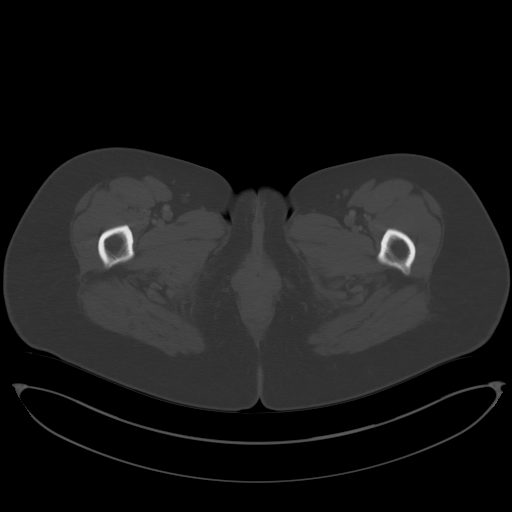
[im 11/94  soft-tissue]
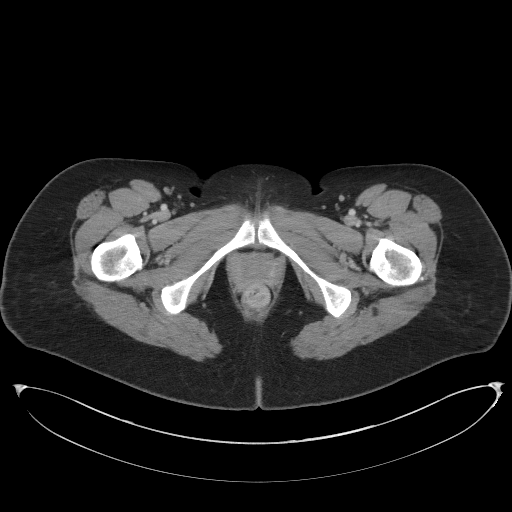
[im 21/94  soft-tissue]
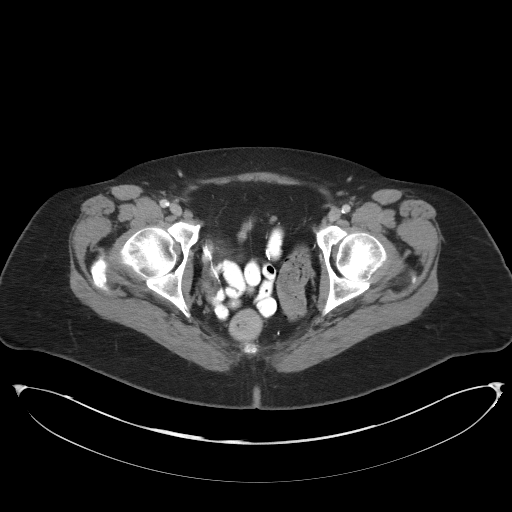
[im 26/94  soft-tissue]
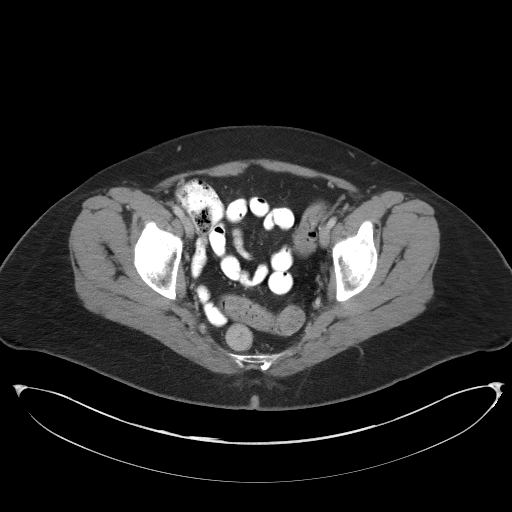
[im 32/94  soft-tissue]
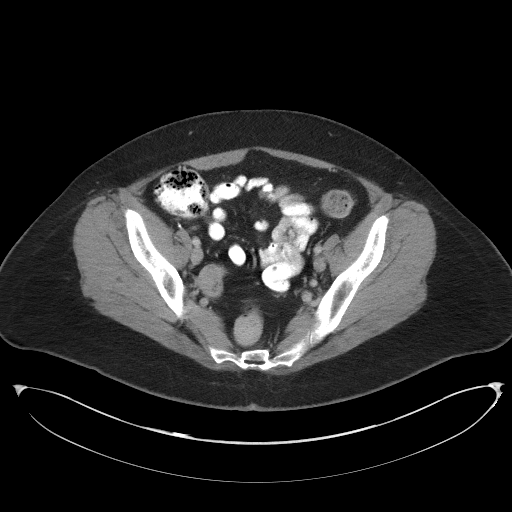
[im 42/94  soft-tissue]
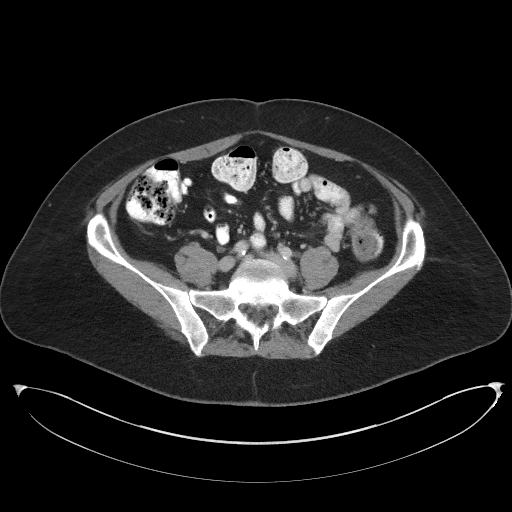
[im 47/94  soft-tissue]
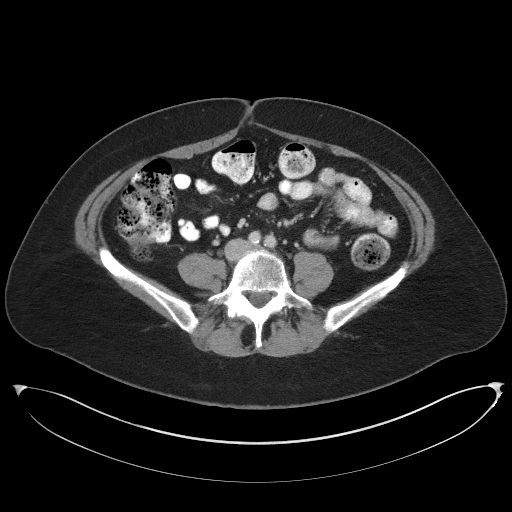
[im 52/94  soft-tissue]
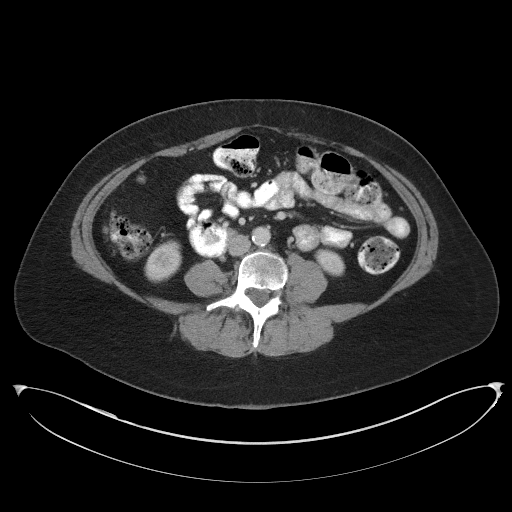
[im 63/94  soft-tissue]
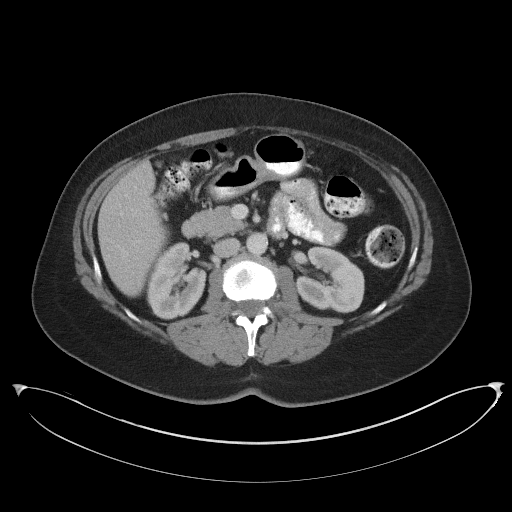
[im 63/94  bone]
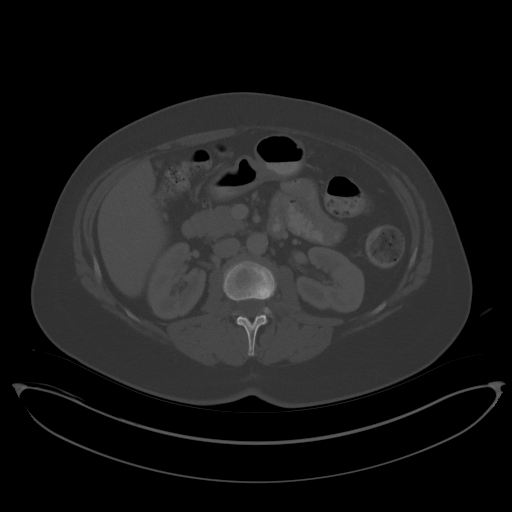
[im 68/94  soft-tissue]
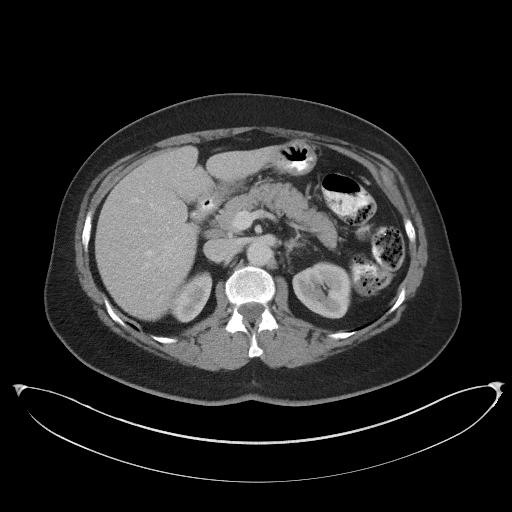
[im 73/94  soft-tissue]
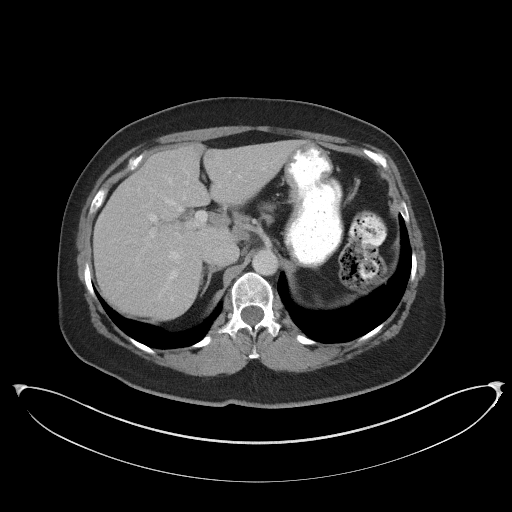
[im 73/94  lung]
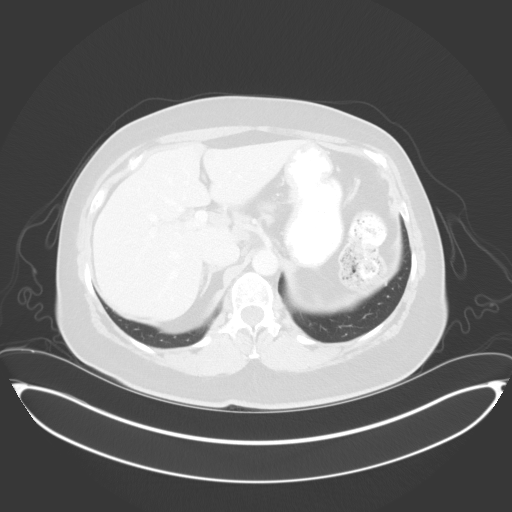
[im 78/94  lung]
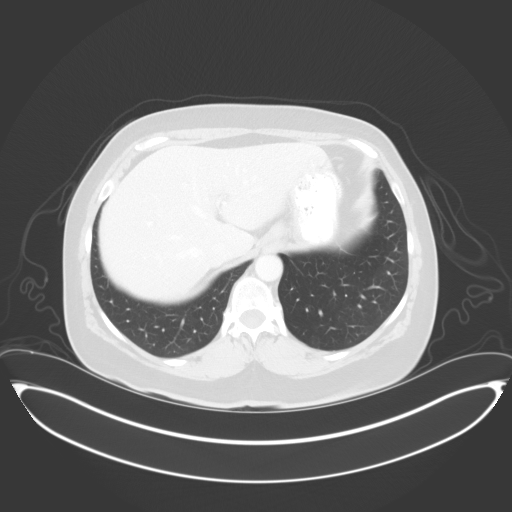
[im 83/94  soft-tissue]
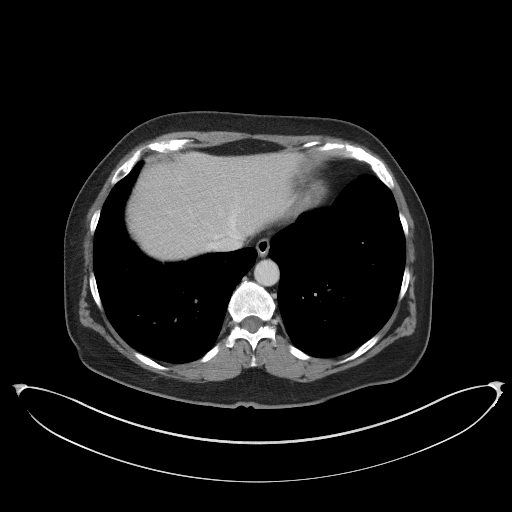
[im 83/94  lung]
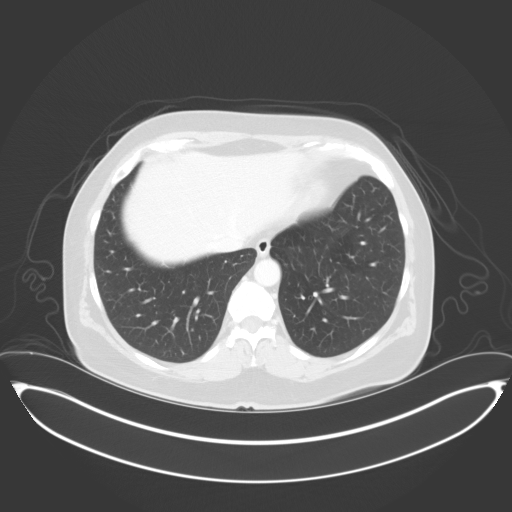
[im 88/94  soft-tissue]
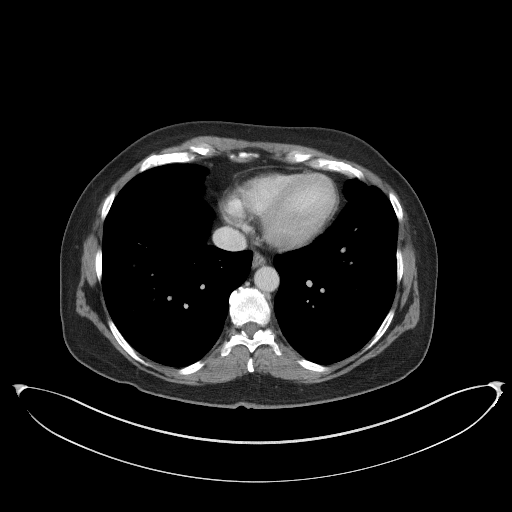
[im 88/94  lung]
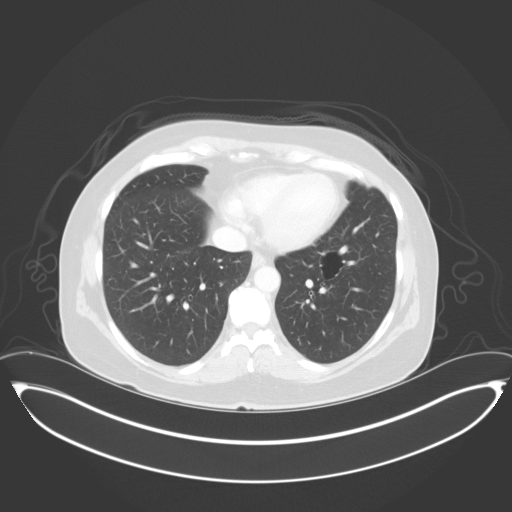

[14 of 32 positions shown; findings below may reference images not displayed]

FINDINGS: Lower chest: No acute abnormality.

Hepatobiliary: No focal liver abnormality is seen. Status post
cholecystectomy. No biliary dilatation.

Pancreas: Unremarkable. No pancreatic ductal dilatation or
surrounding inflammatory changes.

Spleen: Previous splenectomy.

Adrenals/Urinary Tract: The adrenal glands are normal. Unremarkable
appearance of both kidneys. The urinary bladder appears within
normal limits.

Stomach/Bowel: The stomach is unremarkable. Normal appearance of the
small bowel loops. The colon is unremarkable.

Vascular/Lymphatic: Mild aortic atherosclerosis. No aneurysm. Porta
hepatis lymph node measures 11 mm, image number 22 of series 2.
Portacaval lymph node Measures 10 mm, image 23 of series 2.
Previously 11 mm.

Reproductive: Status post hysterectomy. No adnexal masses.

Other: Regenerative splenic tissue is again noted within the right
hemiabdomen, similar to previous exam.

Musculoskeletal: Mild scoliosis and degenerative disc disease. No
acute or significant osseous findings.
IMPRESSION: 1. No acute findings and no explanation for patient's left lower
quadrant pain.
2. Stable borderline enlarged upper abdominal lymph nodes.

## 2018-06-09 ENCOUNTER — Ambulatory Visit (INDEPENDENT_AMBULATORY_CARE_PROVIDER_SITE_OTHER): Payer: BLUE CROSS/BLUE SHIELD

## 2018-06-09 DIAGNOSIS — Z23 Encounter for immunization: Secondary | ICD-10-CM

## 2018-07-05 ENCOUNTER — Encounter: Payer: Self-pay | Admitting: Nurse Practitioner

## 2018-07-19 ENCOUNTER — Other Ambulatory Visit: Payer: Self-pay | Admitting: Nurse Practitioner

## 2018-07-19 DIAGNOSIS — Z9109 Other allergy status, other than to drugs and biological substances: Secondary | ICD-10-CM

## 2018-08-02 ENCOUNTER — Other Ambulatory Visit: Payer: Self-pay | Admitting: Nurse Practitioner

## 2018-08-02 DIAGNOSIS — F410 Panic disorder [episodic paroxysmal anxiety] without agoraphobia: Secondary | ICD-10-CM

## 2018-08-02 DIAGNOSIS — F418 Other specified anxiety disorders: Secondary | ICD-10-CM

## 2018-08-29 ENCOUNTER — Other Ambulatory Visit: Payer: Self-pay | Admitting: Nurse Practitioner

## 2018-08-29 DIAGNOSIS — Z9109 Other allergy status, other than to drugs and biological substances: Secondary | ICD-10-CM

## 2018-09-21 ENCOUNTER — Encounter: Payer: Self-pay | Admitting: Nurse Practitioner

## 2018-09-21 DIAGNOSIS — Z9109 Other allergy status, other than to drugs and biological substances: Secondary | ICD-10-CM

## 2018-09-21 DIAGNOSIS — E785 Hyperlipidemia, unspecified: Secondary | ICD-10-CM

## 2018-09-21 DIAGNOSIS — F410 Panic disorder [episodic paroxysmal anxiety] without agoraphobia: Secondary | ICD-10-CM

## 2018-09-21 DIAGNOSIS — F418 Other specified anxiety disorders: Secondary | ICD-10-CM

## 2018-09-22 MED ORDER — MONTELUKAST SODIUM 10 MG PO TABS: 10.0000 mg | ORAL_TABLET | Freq: Every day | ORAL | 0 refills | Status: DC

## 2018-09-22 MED ORDER — CETIRIZINE HCL 10 MG PO TABS: 10.0000 mg | ORAL_TABLET | Freq: Every day | ORAL | 3 refills | Status: DC

## 2018-09-22 MED ORDER — ATORVASTATIN CALCIUM 40 MG PO TABS: 40.0000 mg | ORAL_TABLET | Freq: Every day | ORAL | 0 refills | Status: DC

## 2018-09-22 MED ORDER — FLUTICASONE PROPIONATE 50 MCG/ACT NA SUSP: 2.0000 | Freq: Every day | NASAL | 11 refills | Status: DC

## 2018-09-22 MED ORDER — SERTRALINE HCL 50 MG PO TABS: 50.0000 mg | ORAL_TABLET | Freq: Every day | ORAL | 0 refills | Status: DC

## 2018-09-23 ENCOUNTER — Telehealth: Payer: Self-pay

## 2018-09-28 NOTE — Telephone Encounter (Signed)
Open in error

## 2018-10-04 ENCOUNTER — Other Ambulatory Visit: Payer: Self-pay | Admitting: Nurse Practitioner

## 2018-10-04 DIAGNOSIS — F418 Other specified anxiety disorders: Secondary | ICD-10-CM

## 2018-10-04 DIAGNOSIS — F410 Panic disorder [episodic paroxysmal anxiety] without agoraphobia: Secondary | ICD-10-CM

## 2018-10-08 ENCOUNTER — Ambulatory Visit: Payer: BLUE CROSS/BLUE SHIELD | Admitting: Nurse Practitioner

## 2018-10-14 ENCOUNTER — Encounter: Payer: Self-pay | Admitting: Nurse Practitioner

## 2018-10-14 ENCOUNTER — Ambulatory Visit (INDEPENDENT_AMBULATORY_CARE_PROVIDER_SITE_OTHER): Payer: Managed Care, Other (non HMO) | Admitting: Nurse Practitioner

## 2018-10-14 ENCOUNTER — Other Ambulatory Visit: Payer: Self-pay

## 2018-10-14 VITALS — BP 108/47 | HR 78 | Temp 98.2°F | Ht 68.0 in | Wt 191.6 lb

## 2018-10-14 DIAGNOSIS — R928 Other abnormal and inconclusive findings on diagnostic imaging of breast: Secondary | ICD-10-CM

## 2018-10-14 DIAGNOSIS — F410 Panic disorder [episodic paroxysmal anxiety] without agoraphobia: Secondary | ICD-10-CM | POA: Diagnosis not present

## 2018-10-14 DIAGNOSIS — E7800 Pure hypercholesterolemia, unspecified: Secondary | ICD-10-CM

## 2018-10-14 DIAGNOSIS — F418 Other specified anxiety disorders: Secondary | ICD-10-CM | POA: Diagnosis not present

## 2018-10-14 DIAGNOSIS — Z1231 Encounter for screening mammogram for malignant neoplasm of breast: Secondary | ICD-10-CM

## 2018-10-14 MED ORDER — SERTRALINE HCL 50 MG PO TABS: 50.0000 mg | ORAL_TABLET | Freq: Every day | ORAL | 3 refills | Status: DC

## 2018-10-14 NOTE — Patient Instructions (Addendum)
Kelsey Key,   Thank you for coming in to clinic today.  1. Start with 10 cigarettes in your pack every morning.  Then slowly cut back how many you smoke in a day.  2. [facility] mammogram order placed.  If you don't get a call from [facility], call clinic here.  3. Continue meds without changes today.  4. Work toward goal of walking 1-2 days this week to 10,000 steps and slowly increase by days per week until reaching goal 5-6 days out of 7.  5. Labs fasting at [place] in next week.   Please schedule a follow-up appointment with Cassell Smiles, AGNP. Return in about 6 months (around 04/14/2019) for annual physical AND 1 year cholesterol and anxiety.  If you have any other questions or concerns, please feel free to call the clinic or send a message through [place]. You may also schedule an earlier appointment if necessary.  You will receive a survey after today's visit either digitally by e-mail or paper by [employer]. Your experiences and feedback matter to [place].  Please respond so we know how we are doing as we provide care for you.   Cassell Smiles, DNP, AGNP-BC Adult Gerontology Nurse Practitioner [facility], [facility]  Low Back Pain Exercises See other page with pictures of each exercise.  Start with 1 or 2 of these exercises that you are most comfortable with. Do not do any exercises that cause you significant worsening pain. Some of these may cause some "stretching soreness" but it should go away after you stop the exercise, and get better over time. Gradually increase up to 3-4 exercises as tolerated.  Standing hamstring stretch: Place the heel of your leg on a stool about 15 inches high. Keep your knee straight. Lean forward, bending at the hips until you feel a mild stretch in the back of your thigh. Make sure you do not roll your shoulders and bend at the waist when doing this or you will stretch your lower back instead. Hold the stretch for 15 to 30 seconds. Repeat 3  times. Repeat the same stretch on your other leg.  Cat and camel: Get down on your hands and knees. Let your stomach sag, allowing your back to curve downward. Hold this position for 5 seconds. Then arch your back and hold for 5 seconds. Do 3 sets of 10.  Quadriped Arm/Leg Raises: Get down on your hands and knees. Tighten your abdominal muscles to stiffen your spine. While keeping your abdominals tight, raise one arm and the opposite leg away from you. Hold this position for 5 seconds. Lower your arm and leg slowly and alternate sides. Do this 10 times on each side.  Pelvic tilt: Lie on your back with your knees bent and your feet flat on the floor. Tighten your abdominal muscles and push your lower back into the floor. Hold this position for 5 seconds, then relax. Do 3 sets of 10.  Partial curl: Lie on your back with your knees bent and your feet flat on the floor. Tighten your stomach muscles and flatten your back against the floor. Tuck your chin to your chest. With your hands stretched out in front of you, curl your upper body forward until your shoulders clear the floor. Hold this position for 3 seconds. Don't hold your breath. It helps to breathe out as you lift your shoulders up. Relax. Repeat 10 times. Build to 3 sets of 10. To challenge yourself, clasp your hands behind your  head and keep your elbows out to the side.  Lower trunk rotation: Lie on your back with your knees bent and your feet flat on the floor. Tighten your abdominal muscles and push your lower back into the floor. Keeping your shoulders down flat, gently rotate your legs to one side, then the other as far as you can. Repeat 10 to 20 times.  Single knee to chest stretch: Lie on your back with your legs straight out in front of you. Bring one knee up to your chest and grasp the back of your thigh. Pull your knee toward your chest, stretching your buttock muscle. Hold this position for 15 to 30 seconds and return to the starting  position. Repeat 3 times on each side.  Double knee to chest: Lie on your back with your knees bent and your feet flat on the floor. Tighten your abdominal muscles and push your lower back into the floor. Pull both knees up to your chest. Hold for 5 seconds and repeat 10 to 20 times.

## 2018-10-14 NOTE — Progress Notes (Signed)
Subjective:    Patient ID: Kelsey Key, female    DOB: [birth year], 58 y.o.   MRN: [id number]  Kelsey Key is a 58 y.o. female presenting on 10/14/2018 for Anxiety   HPI Hyperlipidemia Patient is taking atorvastatin without myalgias or other side effects.  Patient notes winter is hard for healthy diet/lifestyle.  Will resume more activity in spring.  Has new treadmill with installation this weekend. - patient has set goal to use treadmill 10,000 steps daily.  At work sits and only has done 3,094 today so far.  Would go home and walk additional steps for next goal, but has not started yet. - Pt denies changes in vision, chest tightness/pressure, palpitations, shortness of breath, leg pain while walking, leg or arm weakness, and sudden loss of speech or loss of consciousness.  Anxiety Started new job 12/30 and has had significantly reduced anxiety.  Going well and "I love it."  Has new job anxieties with new experience. - Xanax only when flying and has not flown in over 1.5 years.  Had occasional doses at end of last job, but uses very sparingly.  Feels "a xanax hangover the next day, so avoids." - Patient still has 8 tabs from last fill.  GAD 7 : Generalized Anxiety Score 10/14/2018 03/19/2018 03/27/2017  Nervous, Anxious, on Edge 0 0 1  Control/stop worrying 0 0 1  Worry too much - different things 0 1 1  Trouble relaxing 0 1 0  Restless 0 1 0  Easily annoyed or irritable 0 1 0  Afraid - awful might happen 0 0 1  Total GAD 7 Score 0 4 4  Anxiety Difficulty Not difficult at all Somewhat difficult -     Depression screen [facility] 2/9 10/14/2018 03/19/2018 03/27/2017  Decreased Interest 0 0 0  Down, Depressed, Hopeless 0 1 1  PHQ - 2 Score 0 1 1  Altered sleeping 1 2 1   Tired, decreased energy 0 2 1  Change in appetite 0 0 0  Feeling bad or failure about yourself  0 0 0  Trouble concentrating 0 1 0  Moving slowly or fidgety/restless 0 0 0  Suicidal thoughts 0 0 0  PHQ-9 Score 1 6 3     Difficult doing work/chores Not difficult at all Not difficult at all -   Smoking: 1/2 to 3/4 ppd.  Patient has limitations with smoking at work now.   "Would love to" quit, but not ready to quit.   Social History   Tobacco Use  . Smoking status: Current Every Day Smoker    Packs/day: 0.75    Years: 30.00    Pack years: 22.50    Types: Cigarettes  . Smokeless tobacco: Never Used  Substance Use Topics  . Alcohol use: No  . Drug use: No    Review of Systems Per HPI unless specifically indicated above     Objective:    BP (!) 108/47 (BP Location: Right Arm, Patient Position: Sitting, Cuff Size: Normal)   Pulse 78   Temp 98.2 F (36.8 C) (Oral)   Ht 5\' 8"  (1.727 m)   Wt 191 lb 9.6 oz (86.9 kg)   BMI 29.13 kg/m   Wt Readings from Last 3 Encounters:  10/14/18 191 lb 9.6 oz (86.9 kg)  03/19/18 187 lb 3.2 oz (84.9 kg)  03/10/18 188 lb 3.2 oz (85.4 kg)    Physical Exam Vitals signs reviewed.  Constitutional:      General: She is awake. She  is not in acute distress.    Appearance: She is well-developed.  HENT:     Head: Normocephalic and atraumatic.  Neck:     Musculoskeletal: Normal range of motion and neck supple.     Vascular: No carotid bruit.  Cardiovascular:     Rate and Rhythm: Normal rate and regular rhythm.     Pulses:          Radial pulses are 2+ on the right side and 2+ on the left side.       Posterior tibial pulses are 1+ on the right side and 1+ on the left side.     Heart sounds: Normal heart sounds, S1 normal and S2 normal.  Pulmonary:     Effort: Pulmonary effort is normal. No respiratory distress.     Breath sounds: Normal breath sounds and air entry.  Abdominal:     General: Bowel sounds are normal. There is no distension.     Palpations: Abdomen is soft.     Tenderness: There is no abdominal tenderness.     Hernia: No hernia is present.  Skin:    General: Skin is warm and dry.     Capillary Refill: Capillary refill takes less than 2  seconds.  Neurological:     General: No focal deficit present.     Mental Status: She is alert and oriented to person, place, and time. Mental status is at baseline.  Psychiatric:        Attention and Perception: Attention normal.        Mood and Affect: Mood and affect normal.        Behavior: Behavior normal. Behavior is cooperative.        Thought Content: Thought content normal.        Judgment: Judgment normal.    Results for orders placed or performed in visit on 03/19/18  Lipid panel  Result Value Ref Range   Cholesterol, Total 176 100 - 199 mg/dL   Triglycerides 83 0 - 149 mg/dL   HDL 56 >39 mg/dL   VLDL Cholesterol Cal 17 5 - 40 mg/dL   LDL Calculated 103 (H) 0 - 99 mg/dL   Chol/HDL Ratio 3.1 0.0 - 4.4 ratio  TSH  Result Value Ref Range   TSH 2.020 0.450 - 4.500 uIU/mL  CBC with Differential/Platelet  Result Value Ref Range   WBC 10.9 (H) 3.4 - 10.8 x10E3/uL   RBC 4.25 3.77 - 5.28 x10E6/uL   Hemoglobin 13.6 11.1 - 15.9 g/dL   Hematocrit 39.7 34.0 - 46.6 %   MCV 93 79 - 97 fL   MCH 32.0 26.6 - 33.0 pg   MCHC 34.3 31.5 - 35.7 g/dL   RDW 13.8 12.3 - 15.4 %   Platelets 411 150 - 450 x10E3/uL   Neutrophils 61 Not Estab. %   Lymphs 29 Not Estab. %   Monocytes 8 Not Estab. %   Eos 1 Not Estab. %   Basos 1 Not Estab. %   Neutrophils Absolute 6.8 1.4 - 7.0 x10E3/uL   Lymphocytes Absolute 3.1 0.7 - 3.1 x10E3/uL   Monocytes Absolute 0.8 0.1 - 0.9 x10E3/uL   EOS (ABSOLUTE) 0.1 0.0 - 0.4 x10E3/uL   Basophils Absolute 0.1 0.0 - 0.2 x10E3/uL   Immature Granulocytes 0 Not Estab. %   Immature Grans (Abs) 0.0 0.0 - 0.1 x10E3/uL  Comprehensive metabolic panel  Result Value Ref Range   Glucose 103 (H) 65 - 99 mg/dL   BUN 11 6 -  24 mg/dL   Creatinine, Ser 0.90 0.57 - 1.00 mg/dL   GFR calc non Af Amer 71 >59 mL/min/1.73   GFR calc Af Amer 82 >59 mL/min/1.73   BUN/Creatinine Ratio 12 9 - 23   Sodium 142 134 - 144 mmol/L   Potassium 4.4 3.5 - 5.2 mmol/L   Chloride 105 96 -  106 mmol/L   CO2 21 20 - 29 mmol/L   Calcium 9.2 8.7 - 10.2 mg/dL   Total Protein 6.2 6.0 - 8.5 g/dL   Albumin 4.2 3.5 - 5.5 g/dL   Globulin, Total 2.0 1.5 - 4.5 g/dL   Albumin/Globulin Ratio 2.1 1.2 - 2.2   Bilirubin Total 0.4 0.0 - 1.2 mg/dL   Alkaline Phosphatase 50 39 - 117 IU/L   AST 12 0 - 40 IU/L   ALT 14 0 - 32 IU/L      Assessment & Plan:   Problem List Items Addressed This Visit      Other   Hyperlipidemia Previously improved hyperlipidemia, but not at goals.  Current status unknown.  Currently taking atorvastatin and tolerating well. No new ASCVD events since last visit.   - No personal history of prior ASCVD events  Plan: 1. Continue statin once daily. - Reviewed common side effects of myalgia (reversible off med), less common side effects of cognitive impairment (reversible off med), increased glucose, rhabdomyolysis. 2. Discussed low glycemic diet. - handout provided 3. Discussed increasing exercise to 30 minutes most days of the week. 4. Labs ordered today 5. Follow-up 3 months.    Relevant Orders   Lipid panel   TSH   Comprehensive metabolic panel   Panic disorder   Relevant Medications   sertraline (ZOLOFT) 50 MG tablet   Situational anxiety - Primary Anxiety and panic disorder currently well controlled with improved situational anxiety from job stress.   PHQ and GAD7 improved today.  Plan: 1. Continue regular sertraline dosing - stable today. 2. Discussed goals of increasing exercise for anxiety management. 3. Continue management with non-pharm strategies. 4. Follow-up 6 months.   Relevant Medications   sertraline (ZOLOFT) 50 MG tablet    Other Visit Diagnoses    Abnormal screening mammogram       Breast cancer screening by mammogram     Patient due for repeat screening, last mammo abnormal, so actually overdue for repeat diagnostic imaging.  Order sent to [facility] imaging center per patient preference. Follow-up after image prn.   Relevant  Orders   MM DIGITAL SCREENING BILATERAL      Meds ordered this encounter  Medications  . sertraline (ZOLOFT) 50 MG tablet    Sig: Take 1 tablet (50 mg total) by mouth daily.    Dispense:  90 tablet    Refill:  3    Order Specific Question:   Supervising Provider    Answer:   Olin Hauser [[id number]]    Follow up plan: Return in about 6 months (around 04/14/2019) for annual physical AND 1 year cholesterol and anxiety.  Cassell Smiles, DNP, AGPCNP-BC Adult Gerontology Primary Care Nurse Practitioner [facility] 10/14/2018, 4:11 PM

## 2018-10-17 ENCOUNTER — Encounter: Payer: Self-pay | Admitting: Nurse Practitioner

## 2018-11-14 ENCOUNTER — Other Ambulatory Visit: Payer: Self-pay | Admitting: Nurse Practitioner

## 2018-11-14 DIAGNOSIS — E785 Hyperlipidemia, unspecified: Secondary | ICD-10-CM

## 2018-11-14 DIAGNOSIS — Z9109 Other allergy status, other than to drugs and biological substances: Secondary | ICD-10-CM

## 2019-01-19 ENCOUNTER — Encounter: Payer: Self-pay | Admitting: Nurse Practitioner

## 2019-01-21 ENCOUNTER — Telehealth: Payer: Self-pay

## 2019-01-21 NOTE — Telephone Encounter (Signed)
The pt state her symptoms have started to improve. She will give [place] a call back if she need [place].

## 2019-01-31 ENCOUNTER — Encounter: Payer: Self-pay | Admitting: Nurse Practitioner

## 2019-01-31 DIAGNOSIS — F418 Other specified anxiety disorders: Secondary | ICD-10-CM

## 2019-01-31 DIAGNOSIS — E785 Hyperlipidemia, unspecified: Secondary | ICD-10-CM

## 2019-01-31 DIAGNOSIS — Z9109 Other allergy status, other than to drugs and biological substances: Secondary | ICD-10-CM

## 2019-01-31 DIAGNOSIS — F410 Panic disorder [episodic paroxysmal anxiety] without agoraphobia: Secondary | ICD-10-CM

## 2019-02-01 MED ORDER — ATORVASTATIN CALCIUM 40 MG PO TABS: 40.0000 mg | ORAL_TABLET | Freq: Every day | ORAL | 1 refills | Status: DC

## 2019-02-01 MED ORDER — SERTRALINE HCL 50 MG PO TABS: 50.0000 mg | ORAL_TABLET | Freq: Every day | ORAL | 1 refills | Status: DC

## 2019-02-01 MED ORDER — MONTELUKAST SODIUM 10 MG PO TABS: 10.0000 mg | ORAL_TABLET | Freq: Every day | ORAL | 1 refills | Status: DC

## 2019-02-23 ENCOUNTER — Ambulatory Visit (INDEPENDENT_AMBULATORY_CARE_PROVIDER_SITE_OTHER): Payer: Managed Care, Other (non HMO) | Admitting: Family Medicine

## 2019-02-23 ENCOUNTER — Encounter: Payer: Self-pay | Admitting: Family Medicine

## 2019-02-23 ENCOUNTER — Other Ambulatory Visit: Payer: Self-pay

## 2019-02-23 ENCOUNTER — Ambulatory Visit: Payer: Managed Care, Other (non HMO) | Admitting: Family Medicine

## 2019-02-23 DIAGNOSIS — L237 Allergic contact dermatitis due to plants, except food: Secondary | ICD-10-CM | POA: Diagnosis not present

## 2019-02-23 MED ORDER — TRIAMCINOLONE ACETONIDE 0.1 % EX CREA: 1.0000 "application " | TOPICAL_CREAM | Freq: Two times a day (BID) | CUTANEOUS | 0 refills | Status: DC

## 2019-02-23 MED ORDER — PREDNISONE 20 MG PO TABS: ORAL_TABLET | ORAL | 0 refills | Status: DC

## 2019-02-23 NOTE — Patient Instructions (Addendum)
It looks like you do have [place] / [place] Recommend to use topical treatments with Calamine, (may try oatmeal bath), and may take Benadryl at night as needed for itching and sleep.  Start Prednisone (antiinflammatory steroid) - or 7 days. Complete entire course, even if feeling better.  Use topical steroid cream as a spot treatment twice a day for 1-2 weeks  Can take OTC Benadryl for itching as needed, follow package instructions  If symptoms get worse or do not resolve at all within 2 weeks, please return for re-evaluation. Otherwise, if improved but start to come back, you may call for refill on Prednisone - if develop swelling, redness, fever, and warmth, drainage of pus concern for infection please return sooner. - Try your best to avoid scratching (reduce chances of infection)  In the future, to help prevent poison ivy skin rash it is recommended to thoroughly wash potentially exposed areas with soap or even liquid dish soap (if it is hand safe) within 30 minutes of initial exposure. You can reduce risk of flare up if you wash within 1 to 3 hours, but if you wait more than 3 hours, high likelihood of developing the rash.   Please schedule a Follow-up Appointment to: Return in about 1 week (around 03/02/2019), or if symptoms worsen or fail to improve, for poison ivy.  If you have any other questions or concerns, please feel free to call the office or send a message through [place]. You may also schedule an earlier appointment if necessary.  Additionally, you may be receiving a survey about your experience at our office within a few days to 1 week by e-mail or mail. We value your feedback.  Nobie Putnam, DO [place]

## 2019-02-23 NOTE — Progress Notes (Signed)
Virtual Visit via Telephone The purpose of this virtual visit is to provide medical care while limiting exposure to the novel coronavirus (COVID19) for both patient and office staff.  Consent was obtained for phone visit:  Yes.   Answered questions that patient had about telehealth interaction:  Yes.   I discussed the limitations, risks, security and privacy concerns of performing an evaluation and management service by telephone. I also discussed with the patient that there may be a patient responsible charge related to this service. The patient expressed understanding and agreed to proceed.  Patient Location: Home Provider Location: [facility] (Office)  PCP is Cassell Smiles, AGPCNP-BC - I am currently covering during her maternity leave.   ---------------------------------------------------------------------- Chief Complaint  Patient presents with  . Poison Ivy    onset 2 days    S: Reviewed CMA documentation. I have called patient and gathered additional HPI as follows:  POISON IVY CONTACT DERMATITIS Similar episode compared to 1 year ago 02/2018 treated with prednisone 1 week taper and topical steroid with good results Now reports that symptoms started about 2 days ago she was working out in yard clearing some brush, next day she woke up with red raised itchy rash on right arm and left elbow. No OTC med helping, complaining of itching, keeping up at night some  Denies any high risk travel to areas of current concern for COVID19. Denies any known or suspected exposure to person with or possibly with COVID19.  Denies any fevers, chills, sweats, body ache, cough, shortness of breath, sinus pain or pressure, headache, abdominal pain, diarrhea  Past Medical History:  Diagnosis Date  . Anxiety   . Hyperlipidemia   . Osteopenia 03/27/2017   Social History   Tobacco Use  . Smoking status: Current Every Day Smoker    Packs/day: 0.75    Years: 30.00    Pack years:  22.50    Types: Cigarettes  . Smokeless tobacco: Never Used  Substance Use Topics  . Alcohol use: No  . Drug use: No    Current Outpatient Medications:  .  ALPRAZolam (XANAX) 0.25 MG tablet, Take 1 tablet (0.25 mg total) by mouth 2 (two) times daily as needed for anxiety., Disp: 10 tablet, Rfl: 0 .  atorvastatin (LIPITOR) 40 MG tablet, Take 1 tablet (40 mg total) by mouth daily., Disp: 90 tablet, Rfl: 1 .  cetirizine (ZYRTEC) 10 MG tablet, Take 1 tablet (10 mg total) by mouth daily., Disp: 90 tablet, Rfl: 3 .  docusate sodium (COLACE) 100 MG capsule, Take by mouth., Disp: , Rfl:  .  fluticasone (FLONASE) 50 MCG/ACT nasal spray, Place 2 sprays into both nostrils daily., Disp: 16 g, Rfl: 11 .  montelukast (SINGULAIR) 10 MG tablet, Take 1 tablet (10 mg total) by mouth at bedtime., Disp: 90 tablet, Rfl: 1 .  Multiple Vitamin (MULTIVITAMIN) capsule, Take by mouth., Disp: , Rfl:  .  Omega-3 Fatty Acids (FISH OIL) 1200 MG CAPS, Take by mouth., Disp: , Rfl:  .  Probiotic CAPS, , Disp: , Rfl:  .  sertraline (ZOLOFT) 50 MG tablet, Take 1 tablet (50 mg total) by mouth daily., Disp: 90 tablet, Rfl: 1 .  TURMERIC PO, Take by mouth., Disp: , Rfl:  .  vitamin E 400 UNIT capsule, Take 400 Units by mouth daily., Disp: , Rfl:  .  predniSONE (DELTASONE) 20 MG tablet, Take daily with food. Start with 60mg  (3 pills) x 2 days, then reduce to 40mg  (2 pills) x  3 days, then 20mg  (1 pill) x 2 days, Disp: 14 tablet, Rfl: 0 .  triamcinolone cream (KENALOG) 0.1 %, Apply 1 application topically 2 (two) times daily. For up to 1-2 weeks as needed, Disp: 30 g, Rfl: 0  Depression screen [facility] 2/9 02/23/2019 10/14/2018 03/19/2018  Decreased Interest 0 0 0  Down, Depressed, Hopeless 0 0 1  PHQ - 2 Score 0 0 1  Altered sleeping 0 1 2  Tired, decreased energy 0 0 2  Change in appetite 0 0 0  Feeling bad or failure about yourself  0 0 0  Trouble concentrating 0 0 1  Moving slowly or fidgety/restless 0 0 0  Suicidal thoughts 0 0  0  PHQ-9 Score 0 1 6  Difficult doing work/chores Not difficult at all Not difficult at all Not difficult at all    GAD 7 : Generalized Anxiety Score 02/23/2019 10/14/2018 03/19/2018 03/27/2017  Nervous, Anxious, on Edge 0 0 0 1  Control/stop worrying 0 0 0 1  Worry too much - different things 0 0 1 1  Trouble relaxing 0 0 1 0  Restless 0 0 1 0  Easily annoyed or irritable 0 0 1 0  Afraid - awful might happen 0 0 0 1  Total GAD 7 Score 0 0 4 4  Anxiety Difficulty Not difficult at all Not difficult at all Somewhat difficult -    -------------------------------------------------------------------------- O: No physical exam performed due to remote telephone encounter.  Lab results reviewed.  No results found for this or any previous visit (from the past 2160 hour(s)).  -------------------------------------------------------------------------- A&P:  Problem List Items Addressed This Visit    None    Visit Diagnoses    Contact dermatitis due to poison ivy    -  Primary   Relevant Medications   predniSONE (DELTASONE) 20 MG tablet   triamcinolone cream (KENALOG) 0.1 %     Consistent with poison ivy contact dermatitis, x 2 day duration. Patient with known h/o poison ivy, similar with previous exposures - Afebrile, no evidence of bacterial superinfection from scratching  Plan: 1. Prednisone 20mg  tabs - 7 day taper as rx - Add topical triamcinolone steroid same as before 2. May take OTC Benadryl PRN 3. Avoid scratching 4. Return criteria given, 2 weeks if worsening or not improving   Meds ordered this encounter  Medications  . predniSONE (DELTASONE) 20 MG tablet    Sig: Take daily with food. Start with 60mg  (3 pills) x 2 days, then reduce to 40mg  (2 pills) x 3 days, then 20mg  (1 pill) x 2 days    Dispense:  14 tablet    Refill:  0  . triamcinolone cream (KENALOG) 0.1 %    Sig: Apply 1 application topically 2 (two) times daily. For up to 1-2 weeks as needed    Dispense:  30 g     Refill:  0    Follow-up: - Return in 1 week as needed   Patient verbalizes understanding with the above medical recommendations including the limitation of remote medical advice.  Specific follow-up and call-back criteria were given for patient to follow-up or seek medical care more urgently if needed.   - Time spent in direct consultation with patient on phone: 8 minutes   Nobie Putnam, [facility] 02/23/2019, 10:17 AM

## 2019-05-04 ENCOUNTER — Encounter: Payer: Self-pay | Admitting: Nurse Practitioner

## 2019-05-04 ENCOUNTER — Ambulatory Visit (INDEPENDENT_AMBULATORY_CARE_PROVIDER_SITE_OTHER): Payer: Managed Care, Other (non HMO) | Admitting: Nurse Practitioner

## 2019-05-04 ENCOUNTER — Other Ambulatory Visit: Payer: Self-pay

## 2019-05-04 DIAGNOSIS — M545 Low back pain, unspecified: Secondary | ICD-10-CM

## 2019-05-04 MED ORDER — BACLOFEN 10 MG PO TABS: 5.0000 mg | ORAL_TABLET | Freq: Three times a day (TID) | ORAL | 0 refills | Status: DC | PRN

## 2019-05-04 NOTE — Patient Instructions (Signed)
Kelsey Key,   Thank you for coming in to clinic today.  1. Low back pain - May start taking Tylenol extra strength 1 to 2 tablets every 6-8 hours for aches or fever/chills for next few days as needed.  Do not take more than 3,000 mg in 24 hours from all medicines.  May take Ibuprofen as well if tolerated 200-400mg  every 8 hours as needed. May alternate tylenol and ibuprofen in same day. - Use heat and ice.  Apply this for 15 minutes at a time 6-8 times per day.   - Muscle rub with lidocaine, lidocaine patch, Biofreeze, or tiger balm for topical pain relief.  Avoid using this with heat and ice to avoid burns. - START muscle relaxer baclofen 5-10 mg one tablet up to three times daily.  This can cause drowsiness, so use caution.  It may be best to only take this at night for helping you during sleep.   Please schedule a follow-up appointment with Cassell Smiles, AGNP. Return 4-6 weeks if symptoms worsen or fail to improve.  If you have any other questions or concerns, please feel free to call the clinic or send a message through [place]. You may also schedule an earlier appointment if necessary.  You will receive a survey after today's visit either digitally by e-mail or paper by [employer]. Your experiences and feedback matter to [place].  Please respond so we know how we are doing as we provide care for you.  Cassell Smiles, DNP, AGNP-BC Adult Gerontology Nurse Practitioner [facility], [facility]  Low Back Pain Exercises See other page with pictures of each exercise.  Start with 1 or 2 of these exercises that you are most comfortable with. Do not do any exercises that cause you significant worsening pain. Some of these may cause some "stretching soreness" but it should go away after you stop the exercise, and get better over time. Gradually increase up to 3-4 exercises as tolerated.  Standing hamstring stretch: Place the heel of your leg on a stool about 15 inches high. Keep your knee  straight. Lean forward, bending at the hips until you feel a mild stretch in the back of your thigh. Make sure you do not roll your shoulders and bend at the waist when doing this or you will stretch your lower back instead. Hold the stretch for 15 to 30 seconds. Repeat 3 times. Repeat the same stretch on your other leg.  Cat and camel: Get down on your hands and knees. Let your stomach sag, allowing your back to curve downward. Hold this position for 5 seconds. Then arch your back and hold for 5 seconds. Do 3 sets of 10.  Quadriped Arm/Leg Raises: Get down on your hands and knees. Tighten your abdominal muscles to stiffen your spine. While keeping your abdominals tight, raise one arm and the opposite leg away from you. Hold this position for 5 seconds. Lower your arm and leg slowly and alternate sides. Do this 10 times on each side.  Pelvic tilt: Lie on your back with your knees bent and your feet flat on the floor. Tighten your abdominal muscles and push your lower back into the floor. Hold this position for 5 seconds, then relax. Do 3 sets of 10.  Partial curl: Lie on your back with your knees bent and your feet flat on the floor. Tighten your stomach muscles and flatten your back against the floor. Tuck your chin to your chest. With your hands  stretched out in front of you, curl your upper body forward until your shoulders clear the floor. Hold this position for 3 seconds. Don't hold your breath. It helps to breathe out as you lift your shoulders up. Relax. Repeat 10 times. Build to 3 sets of 10. To challenge yourself, clasp your hands behind your head and keep your elbows out to the side.  Lower trunk rotation: Lie on your back with your knees bent and your feet flat on the floor. Tighten your abdominal muscles and push your lower back into the floor. Keeping your shoulders down flat, gently rotate your legs to one side, then the other as far as you can. Repeat 10 to 20 times.  Single knee to chest  stretch: Lie on your back with your legs straight out in front of you. Bring one knee up to your chest and grasp the back of your thigh. Pull your knee toward your chest, stretching your buttock muscle. Hold this position for 15 to 30 seconds and return to the starting position. Repeat 3 times on each side.  Double knee to chest: Lie on your back with your knees bent and your feet flat on the floor. Tighten your abdominal muscles and push your lower back into the floor. Pull both knees up to your chest. Hold for 5 seconds and repeat 10 to 20 times.

## 2019-05-04 NOTE — Progress Notes (Signed)
Telemedicine Encounter: Disclosed to patient at start of encounter that we will provide appropriate telemedicine services.  Patient consents to be treated via phone prior to discussion. - Patient is at her home and is accessed via telephone. - Services are provided by Cassell Smiles from [facility]  Subjective:    Patient ID: Kelsey Key, female    DOB: 06/25/1961, 58 y.o.   MRN: [id number]  Kelsey Key is a 58 y.o. female presenting on 05/04/2019 for Back Pain (LLQ back pain x 1.5 year. The pain worsen with sitting for prolong time.  )  HPI Low back pain Patient has had chronic low back pain with onset about 1.5 years ago.  She seeks care today because no conservative therapies are currently working to improve her pain.  Patient has used chiropractic, massage as alternative therapies.   - Chiropractic treatments are stopped for last several months as they were not helping significantly. - Massage therapy provides some relief.  - Patient is more active at home with home projects this summer and is now aggravating a little more.  Patient is walking more on treadmill as well.  Up to [phone] steps per day.  If she walks more has more spasm.   - Patient notes pain as aching pain, occasionally with spasm.  This pain does not usually limit daily activities significantly.  Social History   Tobacco Use  . Smoking status: Current Every Day Smoker    Packs/day: 0.50    Years: 30.00    Pack years: 15.00    Types: Cigarettes  . Smokeless tobacco: Never Used  Substance Use Topics  . Alcohol use: No  . Drug use: No    Review of Systems Per HPI unless specifically indicated above     Objective:    There were no vitals taken for this visit.  Wt Readings from Last 3 Encounters:  10/14/18 191 lb 9.6 oz (86.9 kg)  03/19/18 187 lb 3.2 oz (84.9 kg)  03/10/18 188 lb 3.2 oz (85.4 kg)    Physical Exam Patient remotely monitored.  Verbal communication appropriate.   Cognition normal.   Results for orders placed or performed in visit on 03/19/18  Lipid panel  Result Value Ref Range   Cholesterol, Total 176 100 - 199 mg/dL   Triglycerides 83 0 - 149 mg/dL   HDL 56 >39 mg/dL   VLDL Cholesterol Cal 17 5 - 40 mg/dL   LDL Calculated 103 (H) 0 - 99 mg/dL   Chol/HDL Ratio 3.1 0.0 - 4.4 ratio  TSH  Result Value Ref Range   TSH 2.020 0.450 - 4.500 uIU/mL  CBC with Differential/Platelet  Result Value Ref Range   WBC 10.9 (H) 3.4 - 10.8 x10E3/uL   RBC 4.25 3.77 - 5.28 x10E6/uL   Hemoglobin 13.6 11.1 - 15.9 g/dL   Hematocrit 39.7 34.0 - 46.6 %   MCV 93 79 - 97 fL   MCH 32.0 26.6 - 33.0 pg   MCHC 34.3 31.5 - 35.7 g/dL   RDW 13.8 12.3 - 15.4 %   Platelets 411 150 - 450 x10E3/uL   Neutrophils 61 Not Estab. %   Lymphs 29 Not Estab. %   Monocytes 8 Not Estab. %   Eos 1 Not Estab. %   Basos 1 Not Estab. %   Neutrophils Absolute 6.8 1.4 - 7.0 x10E3/uL   Lymphocytes Absolute 3.1 0.7 - 3.1 x10E3/uL   Monocytes Absolute 0.8 0.1 - 0.9 x10E3/uL  EOS (ABSOLUTE) 0.1 0.0 - 0.4 x10E3/uL   Basophils Absolute 0.1 0.0 - 0.2 x10E3/uL   Immature Granulocytes 0 Not Estab. %   Immature Grans (Abs) 0.0 0.0 - 0.1 x10E3/uL  Comprehensive metabolic panel  Result Value Ref Range   Glucose 103 (H) 65 - 99 mg/dL   BUN 11 6 - 24 mg/dL   Creatinine, Ser 0.90 0.57 - 1.00 mg/dL   GFR calc non Af Amer 71 >59 mL/min/1.73   GFR calc Af Amer 82 >59 mL/min/1.73   BUN/Creatinine Ratio 12 9 - 23   Sodium 142 134 - 144 mmol/L   Potassium 4.4 3.5 - 5.2 mmol/L   Chloride 105 96 - 106 mmol/L   CO2 21 20 - 29 mmol/L   Calcium 9.2 8.7 - 10.2 mg/dL   Total Protein 6.2 6.0 - 8.5 g/dL   Albumin 4.2 3.5 - 5.5 g/dL   Globulin, Total 2.0 1.5 - 4.5 g/dL   Albumin/Globulin Ratio 2.1 1.2 - 2.2   Bilirubin Total 0.4 0.0 - 1.2 mg/dL   Alkaline Phosphatase 50 39 - 117 IU/L   AST 12 0 - 40 IU/L   ALT 14 0 - 32 IU/L      Assessment & Plan:   Problem List Items Addressed This Visit     None    Visit Diagnoses    Acute midline low back pain without sciatica    -  Primary     Pain likely self-limited.  Muscle strain possible complicated by overuse injuries.  Plan:  1. Treat with OTC pain meds (acetaminophen and ibuprofen).  Discussed alternate dosing and max dosing. 2. Apply heat and/or ice to affected area. 3. May also apply a muscle rub with lidocaine or lidocaine patch after heat or ice. 4. Take muscle relaxer baclofen 5-10 mg up to three times daily.  Cautioned drowsiness.  Can consider Flexeril 5 mg qhs in future if not effective. 5. Physical therapy referral for [facility].  (Paper order into fax box) 6. Follow up 4-6 weeks prn no improvement  Meds ordered this encounter  Medications  . baclofen (LIORESAL) 10 MG tablet    Sig: Take 0.5-1 tablets (5-10 mg total) by mouth 3 (three) times daily as needed for muscle spasms.    Dispense:  30 each    Refill:  0    Order Specific Question:   Supervising Provider    Answer:   Olin Hauser [[id number]]   - Time spent in direct consultation with patient via telemedicine about above concerns: 6 minutes  Follow up plan: Return 4-6 weeks if symptoms worsen or fail to improve.  Cassell Smiles, DNP, AGPCNP-BC Adult Gerontology Primary Care Nurse Practitioner [facility] 05/04/2019, 2:58 PM

## 2019-08-11 DIAGNOSIS — F410 Panic disorder [episodic paroxysmal anxiety] without agoraphobia: Secondary | ICD-10-CM

## 2019-08-11 DIAGNOSIS — F418 Other specified anxiety disorders: Secondary | ICD-10-CM

## 2019-08-11 MED ORDER — SERTRALINE HCL 50 MG PO TABS: 50.0000 mg | ORAL_TABLET | Freq: Every day | ORAL | 0 refills | Status: DC

## 2019-08-11 NOTE — Telephone Encounter (Signed)
Refill given. Needs follow up please.

## 2019-08-30 ENCOUNTER — Ambulatory Visit (INDEPENDENT_AMBULATORY_CARE_PROVIDER_SITE_OTHER): Payer: Managed Care, Other (non HMO) | Admitting: Family Medicine

## 2019-08-30 ENCOUNTER — Other Ambulatory Visit: Payer: Self-pay

## 2019-08-30 ENCOUNTER — Encounter: Payer: Self-pay | Admitting: Family Medicine

## 2019-08-30 DIAGNOSIS — J0111 Acute recurrent frontal sinusitis: Secondary | ICD-10-CM | POA: Diagnosis not present

## 2019-08-30 MED ORDER — AMOXICILLIN-POT CLAVULANATE 875-125 MG PO TABS: 1.0000 | ORAL_TABLET | Freq: Two times a day (BID) | ORAL | 0 refills | Status: DC

## 2019-08-30 MED ORDER — IPRATROPIUM BROMIDE 0.06 % NA SOLN: 2.0000 | Freq: Four times a day (QID) | NASAL | 0 refills | Status: DC

## 2019-08-30 MED ORDER — PREDNISONE 10 MG PO TABS: ORAL_TABLET | ORAL | 0 refills | Status: DC

## 2019-08-30 NOTE — Progress Notes (Signed)
Virtual Visit via Telephone The purpose of this virtual visit is to provide medical care while limiting exposure to the novel coronavirus (COVID19) for both patient and office staff.  Consent was obtained for phone visit:  Yes.   Answered questions that patient had about telehealth interaction:  Yes.   I discussed the limitations, risks, security and privacy concerns of performing an evaluation and management service by telephone. I also discussed with the patient that there may be a patient responsible charge related to this service. The patient expressed understanding and agreed to proceed.  Patient Location: Home Provider Location: [facility])   ---------------------------------------------------------------------- Chief Complaint  Patient presents with  . Sinusitis    2 weeks she put on doxycycline for sinus infection. She notice no improvement with the abx,. No she complains of post nasal drainage, ear fullness w/ left ear pain, scratchy throat, productive cough and chest congestion x 2 weeks     S: Reviewed CMA documentation. I have called patient and gathered additional HPI as follows:  SINUSITIS Reports that symptoms started 2 weeks ago with sinusitis, she followed up with [facility] and they rx Doxycycline for 7 days, she did improve initially but was not 100% improved, and then this past weekend had worsening symptoms with sinus congestion, post nasal drainage, sinus pressure and headache, also left ear fullness and pain.  - Tried OTC Mucinex. She also tried Afrin OTC PRN for 3 days. - Still taking Flonase nasal, cetrizine, montelukast  Denies any high risk travel to areas of current concern for COVID19. Denies any known or suspected exposure to person with or possibly with COVID19.  Admits L ear pain fullness, sore throat, drainage. Denies any fevers, chills, sweats, body ache,  headache, abdominal pain,  diarrhea  -------------------------------------------------------------------------- O: No physical exam performed due to remote telephone encounter.  -------------------------------------------------------------------------- A&P:   Suspected Acute Sinusitis vs Bronchitis with drainage and cough now, possible for benign viral etiology at onset - now concern with progression of symptoms, consider 2nd sickening and cannot rule out bacterial infection. Seems already treated by Telehealth with Doxycycline course but that was early in course and only temporary relief - Known chronic sinus symptoms, history of recurrent sinusitis  - Reassuring without high risk symptoms - Afebrile, without dyspnea - No comorbid pulmonary conditions (asthma, COPD) or immunocompromise  1. Start Augmentin 10 day course BID 2. Add Prednisone 60 to 10mg  taper over 6 days 3. Start Atrovent nasal spray decongestant 2 sprays in each nostril up to 4 times daily for 7 days 4. Continue current allergy therapy  Meds ordered this encounter  Medications  . amoxicillin-clavulanate (AUGMENTIN) 875-125 MG tablet    Sig: Take 1 tablet by mouth 2 (two) times daily. For 10 days    Dispense:  20 tablet    Refill:  0  . predniSONE (DELTASONE) 10 MG tablet    Sig: Take 6 tabs with breakfast Day 1, 5 tabs Day 2, 4 tabs Day 3, 3 tabs Day 4, 2 tabs Day 5, 1 tab Day 6.    Dispense:  21 tablet    Refill:  0  . ipratropium (ATROVENT) 0.06 % nasal spray    Sig: Place 2 sprays into both nostrils 4 (four) times daily. For up to 5-7 days then stop.    Dispense:  15 mL    Refill:  0    OPTIONAL RECOMMENDED self quarantine for patient safety for PREVENTION ONLY. It is not required based on current clinical  symptoms. If they were to develop fever or worsening shortness of breath, then emphasis on REQUIRED quarantine for up to 7-14 days that could be resolved if fever free >3 days AND if symptoms improving after 7 days.   If symptoms do  not resolve or significantly improve OR if WORSENING - fever / cough - or worsening shortness of breath - then should contact us and seek advice on next steps in treatment at home vs where/when to seek care at Urgent Care or Hospital ED for further intervention and possible testing if indicated.  Patient verbalizes understanding with the above medical recommendations including the limitation of remote medical advice.  Specific follow-up / call-back criteria were given for patient to follow-up or seek medical care more urgently if needed.   - Time spent in direct consultation with patient on phone: 7 minutes   Nobie Putnam, [facility] 08/30/2019, 4:32 PM

## 2019-08-30 NOTE — Patient Instructions (Addendum)
Start Augmentin for sinusitis for 10 days Prednisone taper over 6 days Start Atrovent nasal spray decongestant 2 sprays in each nostril up to 4 times daily for 7 days Continue allergy meds Do not recommend Afrin  Follow up sooner if not improve  Please schedule a Follow-up Appointment to: Return in about 2 weeks (around 09/13/2019), or if symptoms worsen or fail to improve, for sinusitis.  If you have any other questions or concerns, please feel free to call the office or send a message through [place]. You may also schedule an earlier appointment if necessary.  Additionally, you may be receiving a survey about your experience at our office within a few days to 1 week by e-mail or mail. We value your feedback.  Nobie Putnam, DO [place]

## 2019-09-16 ENCOUNTER — Other Ambulatory Visit: Payer: Self-pay | Admitting: Family Medicine

## 2019-09-16 DIAGNOSIS — F410 Panic disorder [episodic paroxysmal anxiety] without agoraphobia: Secondary | ICD-10-CM

## 2019-09-16 DIAGNOSIS — F418 Other specified anxiety disorders: Secondary | ICD-10-CM

## 2019-09-18 ENCOUNTER — Other Ambulatory Visit: Payer: Self-pay | Admitting: Family Medicine

## 2019-09-18 DIAGNOSIS — F418 Other specified anxiety disorders: Secondary | ICD-10-CM

## 2019-09-18 DIAGNOSIS — F410 Panic disorder [episodic paroxysmal anxiety] without agoraphobia: Secondary | ICD-10-CM

## 2019-09-20 ENCOUNTER — Other Ambulatory Visit: Payer: Self-pay | Admitting: Family Medicine

## 2019-09-20 DIAGNOSIS — F410 Panic disorder [episodic paroxysmal anxiety] without agoraphobia: Secondary | ICD-10-CM

## 2019-09-20 DIAGNOSIS — F418 Other specified anxiety disorders: Secondary | ICD-10-CM

## 2019-09-20 DIAGNOSIS — Z9109 Other allergy status, other than to drugs and biological substances: Secondary | ICD-10-CM

## 2019-09-20 DIAGNOSIS — E785 Hyperlipidemia, unspecified: Secondary | ICD-10-CM

## 2019-09-20 MED ORDER — SERTRALINE HCL 50 MG PO TABS: 50.0000 mg | ORAL_TABLET | Freq: Every day | ORAL | 1 refills | Status: DC

## 2019-09-27 ENCOUNTER — Other Ambulatory Visit: Payer: Self-pay

## 2019-09-27 DIAGNOSIS — F410 Panic disorder [episodic paroxysmal anxiety] without agoraphobia: Secondary | ICD-10-CM

## 2019-09-27 DIAGNOSIS — F418 Other specified anxiety disorders: Secondary | ICD-10-CM

## 2019-09-27 MED ORDER — MONTELUKAST SODIUM 10 MG PO TABS: 10.0000 mg | ORAL_TABLET | Freq: Every day | ORAL | 1 refills | Status: DC

## 2019-09-27 MED ORDER — ATORVASTATIN CALCIUM 40 MG PO TABS: 40.0000 mg | ORAL_TABLET | Freq: Every day | ORAL | 1 refills | Status: DC

## 2019-09-27 MED ORDER — SERTRALINE HCL 50 MG PO TABS: 50.0000 mg | ORAL_TABLET | Freq: Every day | ORAL | 1 refills | Status: DC

## 2019-09-27 NOTE — Addendum Note (Signed)
Addended by: Olin Hauser on: 09/27/2019 10:56 AM   Modules accepted: Orders

## 2019-09-27 NOTE — Addendum Note (Signed)
Addended by: Olin Hauser on: 09/27/2019 08:34 AM   Modules accepted: Orders

## 2019-12-26 ENCOUNTER — Other Ambulatory Visit: Payer: Self-pay | Admitting: Nurse Practitioner

## 2019-12-26 DIAGNOSIS — Z9109 Other allergy status, other than to drugs and biological substances: Secondary | ICD-10-CM

## 2019-12-26 NOTE — Telephone Encounter (Signed)
Requested medication (s) are due for refill today: yes  Requested medication (s) are on the active medication list: yes  Last refill:  09/22/18  Future visit scheduled: no  Notes to clinic:  prescription expired 07/23/19   Requested Prescriptions  Pending Prescriptions Disp Refills   fluticasone (FLONASE) 50 MCG/ACT nasal spray [Pharmacy Med Name: FLUTICASONE PROP NASAL SPRAY 16GM 50MCG] 48 g 3    Sig: USE 2 SPRAYS IN EACH NOSTRIL DAILY      Ear, Nose, and Throat: Nasal Preparations - Corticosteroids Passed - 12/26/2019  3:17 PM      Passed - Valid encounter within last 12 months    Recent Outpatient Visits           3 months ago Acute recurrent frontal sinusitis   [place], DO   7 months ago Acute midline low back pain without sciatica   [facility] Mikey College, NP   10 months ago Contact dermatitis due to [place], Devonne Doughty, DO   1 year ago Situational anxiety   [facility] Merrilyn Puma, Jerrel Ivory, NP   1 year ago Situational anxiety   [facility] Merrilyn Puma, Jerrel Ivory, NP

## 2019-12-28 ENCOUNTER — Encounter: Payer: Self-pay | Admitting: Family Medicine

## 2019-12-28 ENCOUNTER — Other Ambulatory Visit: Payer: Self-pay

## 2019-12-28 ENCOUNTER — Ambulatory Visit (INDEPENDENT_AMBULATORY_CARE_PROVIDER_SITE_OTHER): Payer: Managed Care, Other (non HMO) | Admitting: Family Medicine

## 2019-12-28 VITALS — BP 110/58 | HR 79 | Temp 97.3°F | Resp 16 | Ht 68.0 in | Wt 194.8 lb

## 2019-12-28 DIAGNOSIS — Z0000 Encounter for general adult medical examination without abnormal findings: Secondary | ICD-10-CM | POA: Diagnosis not present

## 2019-12-28 DIAGNOSIS — J3089 Other allergic rhinitis: Secondary | ICD-10-CM

## 2019-12-28 DIAGNOSIS — F410 Panic disorder [episodic paroxysmal anxiety] without agoraphobia: Secondary | ICD-10-CM | POA: Diagnosis not present

## 2019-12-28 DIAGNOSIS — E785 Hyperlipidemia, unspecified: Secondary | ICD-10-CM

## 2019-12-28 DIAGNOSIS — Z1239 Encounter for other screening for malignant neoplasm of breast: Secondary | ICD-10-CM | POA: Insufficient documentation

## 2019-12-28 DIAGNOSIS — Z1231 Encounter for screening mammogram for malignant neoplasm of breast: Secondary | ICD-10-CM

## 2019-12-28 DIAGNOSIS — Z9109 Other allergy status, other than to drugs and biological substances: Secondary | ICD-10-CM | POA: Diagnosis not present

## 2019-12-28 DIAGNOSIS — R635 Abnormal weight gain: Secondary | ICD-10-CM

## 2019-12-28 DIAGNOSIS — F418 Other specified anxiety disorders: Secondary | ICD-10-CM

## 2019-12-28 LAB — POCT URINALYSIS DIPSTICK
Bilirubin, UA: NEGATIVE
Blood, UA: NEGATIVE
Glucose, UA: NEGATIVE
Ketones, UA: NEGATIVE
Leukocytes, UA: NEGATIVE
Nitrite, UA: NEGATIVE
Protein, UA: NEGATIVE
Spec Grav, UA: 1.015 (ref 1.010–1.025)
Urobilinogen, UA: 0.2 E.U./dL
pH, UA: 5 (ref 5.0–8.0)

## 2019-12-28 MED ORDER — CETIRIZINE HCL 10 MG PO TABS: 10.0000 mg | ORAL_TABLET | Freq: Every day | ORAL | 3 refills | Status: DC

## 2019-12-28 MED ORDER — ATORVASTATIN CALCIUM 40 MG PO TABS: 40.0000 mg | ORAL_TABLET | Freq: Every day | ORAL | 1 refills | Status: DC

## 2019-12-28 MED ORDER — DOCUSATE SODIUM 100 MG PO CAPS: 100.0000 mg | ORAL_CAPSULE | Freq: Every day | ORAL | 1 refills | Status: DC

## 2019-12-28 MED ORDER — SERTRALINE HCL 50 MG PO TABS: 50.0000 mg | ORAL_TABLET | Freq: Every day | ORAL | 1 refills | Status: DC

## 2019-12-28 MED ORDER — FLUTICASONE PROPIONATE 50 MCG/ACT NA SUSP: 2.0000 | Freq: Every day | NASAL | 3 refills | Status: DC

## 2019-12-28 MED ORDER — MONTELUKAST SODIUM 10 MG PO TABS: 10.0000 mg | ORAL_TABLET | Freq: Every day | ORAL | 1 refills | Status: DC

## 2019-12-28 NOTE — Assessment & Plan Note (Signed)
Currently stable and well controlled with sertraline 50mg  tablet daily.  Reports that her situational anxiety/panic disorder has been fairly well managed.  Has a prescription from 03/2018 where 10 alprazolam were prescribed and as of today's visit has 5 left in her pill bottle.  Reports uses these primarily for flying.  Plan: 1. Continue sertraline 50mg  daily. 2. Can continue alprazolam 0.25mg  PRN for panic/flying 3. Follow up in 6 months

## 2019-12-28 NOTE — Assessment & Plan Note (Signed)
Status unknown.  Recheck labs.  Continue meds without changes today.  Refills provided. Followup after labs.  

## 2019-12-28 NOTE — Assessment & Plan Note (Signed)
Pt last mammogram 11/2016 requesting yearly re-evaluation.  Plan: 1. Screening mammogram order placed.  Pt will call to schedule appointment.  Information given.

## 2019-12-28 NOTE — Addendum Note (Signed)
Addended by: Wilson Singer on: 12/28/2019 04:56 PM   Modules accepted: Orders

## 2019-12-28 NOTE — Patient Instructions (Addendum)
Your medication refills have been sent to your pharmacy on file.  As we discussed, have your labs drawn in the next 1-2 weeks and we will contact you with the results.  I have printed your mammogram order as well.  Please contact [facility] to schedule with the same location you had your mammo completed in 2018.  The following recommendations are helpful adjuncts for helping rebalance your mood.  Eat a nourishing diet. Ensure adequate intake of calories, protein, carbs, fat, vitamins, and minerals. Prioritize whole foods at each meal, including meats, vegetables, fruits, nuts and seeds, etc.   Avoid inflammatory and/or "junk" foods, such as sugar, omega-6 fats, refined grains, chemicals, and preservatives are common in packaged and prepared foods. Minimize or completely avoid these ingredients and stick to whole foods with little to no additives. Cook from scratch as much as possible for more control over what you eat  Get enough sleep. Poor sleep is significantly associated with depression and anxiety. Make 7-9 hours of sleep nightly a top priority  Exercise appropriately. Exercise is known to improve brain functioning and boost mood. Aim for 30 minutes of daily physical activity. Avoid "overtraining," which can cause mental disturbances  Assess your light exposure. Not enough natural light during the day and too much artificial light can have a major impact on your mood. Get outside as often as possible during daylight hours. Minimize light exposure after dark and avoid the use of electronics that give off blue light before bed  Manage your stress.  Use daily stress management techniques such as meditation, yoga, or mindfulness to retrain your brain to respond differently to stress. Try deep breathing to deactivate your "fight or flight" response.  There are many of sources with apps like Headspace, Calm or a variety of YouTube videos (videos from Gwynne Edinger have guided meditation)  Prioritize  your social life. Work on building social support with new friends or improve current relationships. Consider getting a pet that allows for companionship, social interaction, and physical touch. Try volunteering or joining a faith-based community to increase your sense of purpose  4-7-8 breathing technique at bedtime: breathe in to count of 4, hold breath for count of 7, exhale for count of 8; do 3-5 times for letting go of overactive thoughts  Take time to play Unstructured "play" time can help reduce anxiety and depression Options for play include music, games, sports, dance, art, etc.  Try to add daily omega 3 fatty acids, magnesium, B complex, and balanced amino acid supplements to help improve mood and anxiety.  We will plan to see you back in 4 months for physical  You will receive a survey after today's visit either digitally by e-mail or paper by [place] mail. Your experiences and feedback matter to [place].  Please respond so we know how we are doing as we provide care for you.  Call us with any questions/concerns/needs.  It is my goal to be available to you for your health concerns.  Thanks for choosing me to be a partner in your healthcare needs!  Harlin Rain, FNP-C Family Nurse Practitioner [facility] Phone: [phone]

## 2019-12-28 NOTE — Assessment & Plan Note (Signed)
Currently stable and well managed with year round zyrtec, singulair and flonase.  Reports typically has 1-2 sinus infections per year, not one as of yet this year.  Has not met with ENT for sinus CT or evaluation if candidate for balloon sinuplasty with chronic sinus infections.  Discussed with patient and determined if becomes chronic sinus infections of 2 or more per year, will rediscuss ENT referral at that time.  Plan: 1. Continue zyrtec, singulair and flonase as directed

## 2019-12-28 NOTE — Progress Notes (Signed)
Subjective:    Patient ID: Kelsey Key, female    DOB: [birth year], 59 y.o.   MRN: [id number]  Kelsey Key is a 59 y.o. female presenting on 12/28/2019 for Anxiety   HPI  Ms. Belger presents to clinic today for follow up on her anxiety and medication refill.  Reports is taking her sertraline 51m daily, tolerating well without side effects.  Denies any acute concerns today.  [facility] 12/28/2019 08/30/2019 02/23/2019  Decreased Interest 0 0 0  Down, Depressed, Hopeless 0 0 0  PHQ - 2 Score 0 0 0  Altered sleeping 0 0 0  Tired, decreased energy 1 0 0  Change in appetite 0 0 0  Feeling bad or failure about yourself  0 0 0  Trouble concentrating 0 0 0  Moving slowly or fidgety/restless 0 0 0  Suicidal thoughts 0 0 0  PHQ-9 Score 1 0 0  Difficult doing work/chores Not difficult at all Not difficult at all Not difficult at all    Social History   Tobacco Use  . Smoking status: Current Every Day Smoker    Packs/day: 0.50    Years: 30.00    Pack years: 15.00    Types: Cigarettes  . Smokeless tobacco: Never Used  Substance Use Topics  . Alcohol use: No  . Drug use: No    Review of Systems  Constitutional: Negative.   HENT: Negative.   Eyes: Negative.   Respiratory: Negative.   Cardiovascular: Negative.   Gastrointestinal: Negative.   Endocrine: Negative.   Genitourinary: Negative.   Musculoskeletal: Negative.   Skin: Negative.   Allergic/Immunologic: Negative.   Neurological: Negative.   Hematological: Negative.   Psychiatric/Behavioral: Negative.    Per HPI unless specifically indicated above     Objective:    BP (!) 110/58   Pulse 79   Temp (!) 97.3 F (36.3 C) (Temporal)   Resp 16   Ht 5' 8"  (1.727 m)   Wt 194 lb 12.8 oz (88.4 kg)   BMI 29.62 kg/m   Wt Readings from Last 3 Encounters:  12/28/19 194 lb 12.8 oz (88.4 kg)  10/14/18 191 lb 9.6 oz (86.9 kg)  03/19/18 187 lb 3.2 oz (84.9 kg)    Physical Exam Vitals reviewed.    Constitutional:      General: She is not in acute distress.    Appearance: Normal appearance. She is well-developed, well-groomed and overweight. She is not ill-appearing or toxic-appearing.  HENT:     Head: Normocephalic.  Eyes:     General: Lids are normal. Vision grossly intact.        Right eye: No discharge.        Left eye: No discharge.     Extraocular Movements: Extraocular movements intact.     Conjunctiva/sclera: Conjunctivae normal.     Pupils: Pupils are equal, round, and reactive to light.  Cardiovascular:     Rate and Rhythm: Normal rate and regular rhythm.     Pulses: Normal pulses.     Heart sounds: Normal heart sounds. No murmur. No friction rub. No gallop.   Pulmonary:     Effort: Pulmonary effort is normal. No respiratory distress.     Breath sounds: Normal breath sounds.  Musculoskeletal:     Right lower leg: No edema.     Left lower leg: No edema.  Skin:    General: Skin is warm and dry.     Capillary Refill: Capillary refill takes less  than 2 seconds.  Neurological:     General: No focal deficit present.     Mental Status: She is alert and oriented to person, place, and time.     Cranial Nerves: No cranial nerve deficit.     Sensory: No sensory deficit.     Motor: No weakness.     Coordination: Coordination normal.     Gait: Gait normal.  Psychiatric:        Attention and Perception: Attention and perception normal.        Mood and Affect: Mood and affect normal.        Speech: Speech normal.        Behavior: Behavior normal. Behavior is cooperative.        Thought Content: Thought content normal.        Cognition and Memory: Cognition and memory normal.        Judgment: Judgment normal.     Results for orders placed or performed in visit on 03/19/18  Lipid panel  Result Value Ref Range   Cholesterol, Total 176 100 - 199 mg/dL   Triglycerides 83 0 - 149 mg/dL   HDL 56 >39 mg/dL   VLDL Cholesterol Cal 17 5 - 40 mg/dL   LDL Calculated 103 (H) 0  - 99 mg/dL   Chol/HDL Ratio 3.1 0.0 - 4.4 ratio  TSH  Result Value Ref Range   TSH 2.020 0.450 - 4.500 uIU/mL  CBC with Differential/Platelet  Result Value Ref Range   WBC 10.9 (H) 3.4 - 10.8 x10E3/uL   RBC 4.25 3.77 - 5.28 x10E6/uL   Hemoglobin 13.6 11.1 - 15.9 g/dL   Hematocrit 39.7 34.0 - 46.6 %   MCV 93 79 - 97 fL   MCH 32.0 26.6 - 33.0 pg   MCHC 34.3 31.5 - 35.7 g/dL   RDW 13.8 12.3 - 15.4 %   Platelets 411 150 - 450 x10E3/uL   Neutrophils 61 Not Estab. %   Lymphs 29 Not Estab. %   Monocytes 8 Not Estab. %   Eos 1 Not Estab. %   Basos 1 Not Estab. %   Neutrophils Absolute 6.8 1.4 - 7.0 x10E3/uL   Lymphocytes Absolute 3.1 0.7 - 3.1 x10E3/uL   Monocytes Absolute 0.8 0.1 - 0.9 x10E3/uL   EOS (ABSOLUTE) 0.1 0.0 - 0.4 x10E3/uL   Basophils Absolute 0.1 0.0 - 0.2 x10E3/uL   Immature Granulocytes 0 Not Estab. %   Immature Grans (Abs) 0.0 0.0 - 0.1 x10E3/uL  Comprehensive metabolic panel  Result Value Ref Range   Glucose 103 (H) 65 - 99 mg/dL   BUN 11 6 - 24 mg/dL   Creatinine, Ser 0.90 0.57 - 1.00 mg/dL   GFR calc non Af Amer 71 >59 mL/min/1.73   GFR calc Af Amer 82 >59 mL/min/1.73   BUN/Creatinine Ratio 12 9 - 23   Sodium 142 134 - 144 mmol/L   Potassium 4.4 3.5 - 5.2 mmol/L   Chloride 105 96 - 106 mmol/L   CO2 21 20 - 29 mmol/L   Calcium 9.2 8.7 - 10.2 mg/dL   Total Protein 6.2 6.0 - 8.5 g/dL   Albumin 4.2 3.5 - 5.5 g/dL   Globulin, Total 2.0 1.5 - 4.5 g/dL   Albumin/Globulin Ratio 2.1 1.2 - 2.2   Bilirubin Total 0.4 0.0 - 1.2 mg/dL   Alkaline Phosphatase 50 39 - 117 IU/L   AST 12 0 - 40 IU/L   ALT 14 0 - 32 IU/L  Assessment & Plan:   Problem List Items Addressed This Visit      Other   Hyperlipidemia    Status unknown.  Recheck labs.  Continue meds without changes today.  Refills provided. Followup after labs.       Relevant Medications   atorvastatin (LIPITOR) 40 MG tablet   Other Relevant Orders   Lipid Profile   Environmental and seasonal  allergies    Currently stable and well managed with year round zyrtec, singulair and flonase.  Reports typically has 1-2 sinus infections per year, not one as of yet this year.  Has not met with ENT for sinus CT or evaluation if candidate for balloon sinuplasty with chronic sinus infections.  Discussed with patient and determined if becomes chronic sinus infections of 2 or more per year, will rediscuss ENT referral at that time.  Plan: 1. Continue zyrtec, singulair and flonase as directed      Relevant Medications   cetirizine (ZYRTEC) 10 MG tablet   fluticasone (FLONASE) 50 MCG/ACT nasal spray   montelukast (SINGULAIR) 10 MG tablet   Panic disorder   Relevant Medications   sertraline (ZOLOFT) 50 MG tablet   Situational anxiety    Currently stable and well controlled with sertraline 23m tablet daily.  Reports that her situational anxiety/panic disorder has been fairly well managed.  Has a prescription from 03/2018 where 10 alprazolam were prescribed and as of today's visit has 5 left in her pill bottle.  Reports uses these primarily for flying.  Plan: 1. Continue sertraline 564mdaily. 2. Can continue alprazolam 0.2557mRN for panic/flying 3. Follow up in 6 months      Relevant Medications   sertraline (ZOLOFT) 50 MG tablet   Screening for breast cancer - Primary    Pt last mammogram 11/2016 requesting yearly re-evaluation.  Plan: 1. Screening mammogram order placed.  Pt will call to schedule appointment.  Information given.       Relevant Orders   MM 3D SCREEN BREAST BILATERAL    Other Visit Diagnoses    Environmental allergies       Relevant Medications   cetirizine (ZYRTEC) 10 MG tablet   fluticasone (FLONASE) 50 MCG/ACT nasal spray   montelukast (SINGULAIR) 10 MG tablet   Environmental allergies       Stable on Flonase.  Refill prescription today.  Follow-up as needed if symptoms worsen.   Relevant Medications   cetirizine (ZYRTEC) 10 MG tablet   fluticasone  (FLONASE) 50 MCG/ACT nasal spray   montelukast (SINGULAIR) 10 MG tablet   Routine medical exam       Relevant Orders   CBC with Differential   CMP14+EGFR   Lipid Profile   Thyroid Panel With TSH   Weight gain       Relevant Orders   Thyroid Panel With TSH      Meds ordered this encounter  Medications  . atorvastatin (LIPITOR) 40 MG tablet    Sig: Take 1 tablet (40 mg total) by mouth daily.    Dispense:  90 tablet    Refill:  1  . cetirizine (ZYRTEC) 10 MG tablet    Sig: Take 1 tablet (10 mg total) by mouth daily.    Dispense:  90 tablet    Refill:  3  . docusate sodium (COLACE) 100 MG capsule    Sig: Take 1 capsule (100 mg total) by mouth daily.    Dispense:  90 capsule    Refill:  1  . fluticasone (FLONASE)  50 MCG/ACT nasal spray    Sig: Place 2 sprays into both nostrils daily.    Dispense:  48 g    Refill:  3  . montelukast (SINGULAIR) 10 MG tablet    Sig: Take 1 tablet (10 mg total) by mouth at bedtime.    Dispense:  90 tablet    Refill:  1  . sertraline (ZOLOFT) 50 MG tablet    Sig: Take 1 tablet (50 mg total) by mouth daily.    Dispense:  90 tablet    Refill:  1      Follow up plan: Return in about 4 months (around 04/29/2020) for Physical.   Harlin Rain, [place] Nurse Practitioner [facility] 12/28/2019, 2:21 PM

## 2020-02-29 LAB — THYROID PANEL WITH TSH
Free Thyroxine Index: 1.6 (ref 1.2–4.9)
T3 Uptake Ratio: 24 % (ref 24–39)
T4, Total: 6.8 ug/dL (ref 4.5–12.0)
TSH: 2.35 u[IU]/mL (ref 0.450–4.500)

## 2020-02-29 LAB — LIPID PANEL
Chol/HDL Ratio: 3 ratio (ref 0.0–4.4)
Cholesterol, Total: 142 mg/dL (ref 100–199)
HDL: 47 mg/dL (ref >39)
LDL Chol Calc (NIH): 73 mg/dL (ref 0–99)
Triglycerides: 121 mg/dL (ref 0–149)
VLDL Cholesterol Cal: 22 mg/dL (ref 5–40)

## 2020-02-29 LAB — CMP14+EGFR
ALT: 16 IU/L (ref 0–32)
AST: 16 IU/L (ref 0–40)
Albumin/Globulin Ratio: 1.9 (ref 1.2–2.2)
Albumin: 4.3 g/dL (ref 3.8–4.9)
Alkaline Phosphatase: 73 IU/L (ref 48–121)
BUN/Creatinine Ratio: 12 (ref 12–28)
BUN: 10 mg/dL (ref 8–27)
Bilirubin Total: 0.4 mg/dL (ref 0.0–1.2)
CO2: 24 mmol/L (ref 20–29)
Calcium: 9.5 mg/dL (ref 8.7–10.3)
Chloride: 105 mmol/L (ref 96–106)
Creatinine, Ser: 0.83 mg/dL (ref 0.57–1.00)
GFR calc Af Amer: 89 mL/min/{1.73_m2} (ref >59)
GFR calc non Af Amer: 77 mL/min/{1.73_m2} (ref >59)
Globulin, Total: 2.3 g/dL (ref 1.5–4.5)
Glucose: 99 mg/dL (ref 65–99)
Potassium: 4.9 mmol/L (ref 3.5–5.2)
Sodium: 140 mmol/L (ref 134–144)
Total Protein: 6.6 g/dL (ref 6.0–8.5)

## 2020-02-29 LAB — CBC WITH DIFFERENTIAL/PLATELET
Basophils Absolute: 0.1 10*3/uL (ref 0.0–0.2)
Basos: 1 %
EOS (ABSOLUTE): 0.1 10*3/uL (ref 0.0–0.4)
Eos: 1 %
Hematocrit: 42 % (ref 34.0–46.6)
Hemoglobin: 13.8 g/dL (ref 11.1–15.9)
Immature Grans (Abs): 0 10*3/uL (ref 0.0–0.1)
Immature Granulocytes: 0 %
Lymphocytes Absolute: 3.7 10*3/uL — ABNORMAL HIGH (ref 0.7–3.1)
Lymphs: 36 %
MCH: 30.9 pg (ref 26.6–33.0)
MCHC: 32.9 g/dL (ref 31.5–35.7)
MCV: 94 fL (ref 79–97)
Monocytes Absolute: 1 10*3/uL — ABNORMAL HIGH (ref 0.1–0.9)
Monocytes: 9 %
Neutrophils Absolute: 5.5 10*3/uL (ref 1.4–7.0)
Neutrophils: 53 %
Platelets: 457 10*3/uL — ABNORMAL HIGH (ref 150–450)
RBC: 4.46 x10E6/uL (ref 3.77–5.28)
RDW: 13.1 % (ref 11.7–15.4)
WBC: 10.3 10*3/uL (ref 3.4–10.8)

## 2020-03-14 ENCOUNTER — Encounter: Payer: Self-pay | Admitting: Family Medicine

## 2020-04-12 ENCOUNTER — Encounter: Payer: Self-pay | Admitting: Family Medicine

## 2020-04-13 ENCOUNTER — Telehealth (INDEPENDENT_AMBULATORY_CARE_PROVIDER_SITE_OTHER): Payer: Managed Care, Other (non HMO) | Admitting: Family Medicine

## 2020-04-13 ENCOUNTER — Encounter: Payer: Self-pay | Admitting: Family Medicine

## 2020-04-13 ENCOUNTER — Other Ambulatory Visit: Payer: Self-pay

## 2020-04-13 VITALS — Temp 98.2°F

## 2020-04-13 DIAGNOSIS — J189 Pneumonia, unspecified organism: Secondary | ICD-10-CM | POA: Diagnosis not present

## 2020-04-13 MED ORDER — PREDNISONE 20 MG PO TABS: 40.0000 mg | ORAL_TABLET | Freq: Every day | ORAL | 0 refills | Status: AC

## 2020-04-13 MED ORDER — ALBUTEROL SULFATE HFA 108 (90 BASE) MCG/ACT IN AERS: 2.0000 | INHALATION_SPRAY | Freq: Four times a day (QID) | RESPIRATORY_TRACT | 0 refills | Status: DC | PRN

## 2020-04-13 MED ORDER — DOXYCYCLINE HYCLATE 100 MG PO TABS: 100.0000 mg | ORAL_TABLET | Freq: Two times a day (BID) | ORAL | 0 refills | Status: DC

## 2020-04-13 NOTE — Progress Notes (Signed)
Virtual Visit via Telephone  The purpose of this virtual visit is to provide medical care while limiting exposure to the novel coronavirus (COVID19) for both patient and office staff.  Consent was obtained for phone visit:  Yes.   Answered questions that patient had about telehealth interaction:  Yes.   I discussed the limitations, risks, security and privacy concerns of performing an evaluation and management service by telephone. I also discussed with the patient that there may be a patient responsible charge related to this service. The patient expressed understanding and agreed to proceed.  Patient is at home and is accessed via telephone Services are provided by Harlin Rain, FNP-C from [facility])  ---------------------------------------------------------------------- Chief Complaint  Patient presents with  . URI     sinus pressure, nasal drainage, post nasal drainage, coughing and chest congestion x13 day. Pt had E-visit was put on predinose about 10 days ago no improvement. Did another E-vist put  Augmentin on 5 days ago with no improvement .    S: Reviewed CMA documentation. I have called patient and gathered additional HPI as follows:  Kelsey Key presents for virtual telemedicine visit today.  Reports that she had met with a virtual provider through her insurance company approx 10 days ago for symptoms that started 13 days ago with sinus pressure, nasal drainage, post nasal drip and coughing.  Reports was placed on prednisone for a short course without improvement.  Followed up 5 days ago and was placed on Augmentin without improvement in symptoms and reports worsening of chest congestion and cough.  Has [facility].  Has a history of seasonal allergies that typically progress into sinus infections about 3x per year and that every once in a while it gets worse and travels into her chest.   Chest has been bothering her for 4 days presently.  Denies fevers, sore throat, change in taste/smell, SOB, abdominal pain, n/v/d.  Patient is currently home Denies any high risk travel to areas of current concern for COVID19. Denies any known or suspected exposure to person with or possibly with COVID19.  Past Medical History:  Diagnosis Date  . Anxiety   . Hyperlipidemia   . Osteopenia 03/27/2017   Social History   Tobacco Use  . Smoking status: Current Every Day Smoker    Packs/day: 0.50    Years: 30.00    Pack years: 15.00    Types: Cigarettes  . Smokeless tobacco: Never Used  Vaping Use  . Vaping Use: Never used  Substance Use Topics  . Alcohol use: No  . Drug use: No    Current Outpatient Medications:  .  ALPRAZolam (XANAX) 0.25 MG tablet, Take 1 tablet (0.25 mg total) by mouth 2 (two) times daily as needed for anxiety., Disp: 10 tablet, Rfl: 0 .  amoxicillin-clavulanate (AUGMENTIN) 875-125 MG tablet, Take 1 tablet by mouth 2 (two) times daily., Disp: , Rfl:  .  atorvastatin (LIPITOR) 40 MG tablet, Take 1 tablet (40 mg total) by mouth daily., Disp: 90 tablet, Rfl: 1 .  calcium-vitamin D (OSCAL WITH D) 250-125 MG-UNIT tablet, Take 1 tablet by mouth daily., Disp: , Rfl:  .  cetirizine (ZYRTEC) 10 MG tablet, Take 1 tablet (10 mg total) by mouth daily., Disp: 90 tablet, Rfl: 3 .  docusate sodium (COLACE) 100 MG capsule, Take 1 capsule (100 mg total) by mouth daily., Disp: 90 capsule, Rfl: 1 .  fluticasone (FLONASE) 50 MCG/ACT  nasal spray, Place 2 sprays into both nostrils daily., Disp: 48 g, Rfl: 3 .  montelukast (SINGULAIR) 10 MG tablet, Take 1 tablet (10 mg total) by mouth at bedtime., Disp: 90 tablet, Rfl: 1 .  Multiple Vitamin (MULTIVITAMIN) capsule, Take by mouth., Disp: , Rfl:  .  Omega-3 Fatty Acids (FISH OIL) 1200 MG CAPS, Take by mouth., Disp: , Rfl:  .  Probiotic CAPS, , Disp: , Rfl:  .  sertraline (ZOLOFT) 50 MG tablet, Take 1 tablet (50 mg total) by mouth daily.,  Disp: 90 tablet, Rfl: 1 .  TURMERIC PO, Take by mouth., Disp: , Rfl:  .  vitamin E 400 UNIT capsule, Take 400 Units by mouth daily., Disp: , Rfl:  .  albuterol (VENTOLIN HFA) 108 (90 Base) MCG/ACT inhaler, Inhale 2 puffs into the lungs every 6 (six) hours as needed for wheezing or shortness of breath., Disp: 8 g, Rfl: 0 .  doxycycline (VIBRA-TABS) 100 MG tablet, Take 1 tablet (100 mg total) by mouth 2 (two) times daily., Disp: 20 tablet, Rfl: 0 .  predniSONE (DELTASONE) 20 MG tablet, Take 2 tablets (40 mg total) by mouth daily with breakfast for 5 days., Disp: 10 tablet, Rfl: 0  Depression screen [facility] 2/9 12/28/2019 08/30/2019 02/23/2019  Decreased Interest 0 0 0  Down, Depressed, Hopeless 0 0 0  PHQ - 2 Score 0 0 0  Altered sleeping 0 0 0  Tired, decreased energy 1 0 0  Change in appetite 0 0 0  Feeling bad or failure about yourself  0 0 0  Trouble concentrating 0 0 0  Moving slowly or fidgety/restless 0 0 0  Suicidal thoughts 0 0 0  PHQ-9 Score 1 0 0  Difficult doing work/chores Not difficult at all Not difficult at all Not difficult at all    GAD 7 : Generalized Anxiety Score 12/28/2019 02/23/2019 10/14/2018 03/19/2018  Nervous, Anxious, on Edge 0 0 0 0  Control/stop worrying 0 0 0 0  Worry too much - different things 0 0 0 1  Trouble relaxing 0 0 0 1  Restless 0 0 0 1  Easily annoyed or irritable 0 0 0 1  Afraid - awful might happen 0 0 0 0  Total GAD 7 Score 0 0 0 4  Anxiety Difficulty Not difficult at all Not difficult at all Not difficult at all Somewhat difficult    -------------------------------------------------------------------------- O: No physical exam performed due to remote telephone encounter.  Physical Exam: Patient remotely monitored without video.  Verbal communication appropriate.  Cognition normal.  Recent Results (from the past 2160 hour(s))  CBC with Differential     Status: Abnormal   Collection Time: 02/25/20  8:15 AM  Result Value Ref Range   WBC 10.3 3.4  - 10.8 x10E3/uL   RBC 4.46 3.77 - 5.28 x10E6/uL   Hemoglobin 13.8 11.1 - 15.9 g/dL   Hematocrit 42.0 34.0 - 46.6 %   MCV 94 79 - 97 fL   MCH 30.9 26.6 - 33.0 pg   MCHC 32.9 31 - 35 g/dL   RDW 13.1 11.7 - 15.4 %   Platelets 457 (H) 150 - 450 x10E3/uL   Neutrophils 53 Not Estab. %   Lymphs 36 Not Estab. %   Monocytes 9 Not Estab. %   Eos 1 Not Estab. %   Basos 1 Not Estab. %   Neutrophils Absolute 5.5 1 - 7 x10E3/uL   Lymphocytes Absolute 3.7 (H) 0 - 3 x10E3/uL   Monocytes Absolute  1.0 (H) 0 - 0 x10E3/uL   EOS (ABSOLUTE) 0.1 0.0 - 0.4 x10E3/uL   Basophils Absolute 0.1 0 - 0 x10E3/uL   Immature Granulocytes 0 Not Estab. %   Immature Grans (Abs) 0.0 0.0 - 0.1 x10E3/uL  CMP14+EGFR     Status: None   Collection Time: 02/25/20  8:15 AM  Result Value Ref Range   Glucose 99 65 - 99 mg/dL   BUN 10 8 - 27 mg/dL   Creatinine, Ser 0.83 0.57 - 1.00 mg/dL   GFR calc non Af Amer 77 >59 mL/min/1.73   GFR calc Af Amer 89 >59 mL/min/1.73    Comment: **[facility] currently reports eGFR in compliance with the current**   recommendations of the [employer]. [facility] will   update reporting as new guidelines are published from the NKF-ASN   Task force.    BUN/Creatinine Ratio 12 12 - 28   Sodium 140 134 - 144 mmol/L   Potassium 4.9 3.5 - 5.2 mmol/L   Chloride 105 96 - 106 mmol/L   CO2 24 20 - 29 mmol/L   Calcium 9.5 8.7 - 10.3 mg/dL   Total Protein 6.6 6.0 - 8.5 g/dL   Albumin 4.3 3.8 - 4.9 g/dL   Globulin, Total 2.3 1.5 - 4.5 g/dL   Albumin/Globulin Ratio 1.9 1.2 - 2.2   Bilirubin Total 0.4 0.0 - 1.2 mg/dL   Alkaline Phosphatase 73 48 - 121 IU/L   AST 16 0 - 40 IU/L   ALT 16 0 - 32 IU/L  Lipid Profile     Status: None   Collection Time: 02/25/20  8:15 AM  Result Value Ref Range   Cholesterol, Total 142 100 - 199 mg/dL   Triglycerides 121 0 - 149 mg/dL   HDL 47 >39 mg/dL   VLDL Cholesterol Cal 22 5 - 40 mg/dL   LDL Chol Calc (NIH) 73 0 - 99 mg/dL   Chol/HDL Ratio 3.0 0.0 -  4.4 ratio    Comment:                                   T. Chol/HDL Ratio                                             Men  Women                               1/2 Avg.Risk  3.4    3.3                                   Avg.Risk  5.0    4.4                                2X Avg.Risk  9.6    7.1                                3X Avg.Risk 23.4   11.0   Thyroid Panel With TSH     Status: None   Collection Time: 02/25/20  8:15 AM  Result Value Ref Range   TSH 2.350 0.450 - 4.500 uIU/mL   T4, Total 6.8 4.5 - 12.0 ug/dL   T3 Uptake Ratio 24 24 - 39 %   Free Thyroxine Index 1.6 1.2 - 4.9    -------------------------------------------------------------------------- A&P:  Problem List Items Addressed This Visit      Respiratory   Pneumonia due to infectious organism - Primary    Day 13 with sinus pressure, nasal drainage, post nasal drip with worsening cough and chest congestion.  Reports failed treatment with prednisone approx 10 days ago, but did not have progression of symptoms into chest at that time.  Reports followed up with an E-Visit through her insurance company and was prescribed Augmentin, without change in symptoms.  Discussed will cover for sinus/chest for probable pneumonia with Doxycycline 13m BID x 10 days.  Will send in 5 day course of prednisone 427mdaily for chest congestion and albuterol HFA inhaler for SOB/Cough/Wheezing  Plan: 1. STOP Augmentin and BEGIN doxycycline 10068mwice per day for the next 10 days 2. BEGIN Prednisone 34m64mily x 5 days 3. BEGIN Albuterol inhaler 1-2 puffs every 4-6 hours as needed for cough, shortness of breath and/or wheezing 4. Can take acetaminophen and/or ibuprofen, according to packaging directions for symptoms 5. Complete COVID testing scheduled for tomorrow 6. RTC if symptoms worsen or fail to improve      Relevant Medications   amoxicillin-clavulanate (AUGMENTIN) 875-125 MG tablet   doxycycline (VIBRA-TABS) 100 MG tablet    predniSONE (DELTASONE) 20 MG tablet   albuterol (VENTOLIN HFA) 108 (90 Base) MCG/ACT inhaler      Meds ordered this encounter  Medications  . doxycycline (VIBRA-TABS) 100 MG tablet    Sig: Take 1 tablet (100 mg total) by mouth 2 (two) times daily.    Dispense:  20 tablet    Refill:  0  . predniSONE (DELTASONE) 20 MG tablet    Sig: Take 2 tablets (40 mg total) by mouth daily with breakfast for 5 days.    Dispense:  10 tablet    Refill:  0  . albuterol (VENTOLIN HFA) 108 (90 Base) MCG/ACT inhaler    Sig: Inhale 2 puffs into the lungs every 6 (six) hours as needed for wheezing or shortness of breath.    Dispense:  8 g    Refill:  0    Follow-up: - Return if symptoms worsen or fail to improve  Patient verbalizes understanding with the above medical recommendations including the limitation of remote medical advice.  Specific follow-up and call-back criteria were given for patient to follow-up or seek medical care more urgently if needed.  - Time spent in direct consultation with patient on phone: 8 minutes  NicoHarlin RainP-[place] 04/13/2020, 10:24 AM

## 2020-04-13 NOTE — Patient Instructions (Addendum)
As we discussed, I have sent in a prescription for doxycycline 100mg  to take 1 tablet 2x per day for the next 10 days.  I have sent in a prescription for prednisone 40mg  to take daily x 5 days.  A prescription for an albuterol inhaler has been sent to your pharmacy to take 1-2 puffs every 4-6 hours as needed for cough, shortness of breath and/or wheezing.  Can try over the counter elderberry, zinc and vitamin C supplement.  Currently, [employer] has a gummy that has all 3 of them present to help with immune support.  [facility]  If you have worsening of symptoms, fever greater than 104 that is not responsive to acetaminophen and/or ibuprofen, shortness of breath, or impending sense of doom to [place].  We will plan to see you back if your symptoms worsen or fail to improve  You will receive a survey after today's visit either digitally by e-mail or paper by USPS mail. Your experiences and feedback matter to [place].  Please respond so we know how we are doing as we provide care for you.  Call us with any questions/concerns/needs.  It is my goal to be available to you for your health concerns.  Thanks for choosing me to be a partner in your healthcare needs!  Harlin Rain, FNP-C Family Nurse Practitioner [facility] Phone: [phone]

## 2020-04-13 NOTE — Assessment & Plan Note (Signed)
Day 13 with sinus pressure, nasal drainage, post nasal drip with worsening cough and chest congestion.  Reports failed treatment with prednisone approx 10 days ago, but did not have progression of symptoms into chest at that time.  Reports followed up with an E-Visit through her insurance company and was prescribed Augmentin, without change in symptoms.  Discussed will cover for sinus/chest for probable pneumonia with Doxycycline 100mg  BID x 10 days.  Will send in 5 day course of prednisone 40mg  daily for chest congestion and albuterol HFA inhaler for SOB/Cough/Wheezing  Plan: 1. STOP Augmentin and BEGIN doxycycline 100mg  twice per day for the next 10 days 2. BEGIN Prednisone 40mg  daily x 5 days 3. BEGIN Albuterol inhaler 1-2 puffs every 4-6 hours as needed for cough, shortness of breath and/or wheezing 4. Can take acetaminophen and/or ibuprofen, according to packaging directions for symptoms 5. Complete COVID testing scheduled for tomorrow 6. RTC if symptoms worsen or fail to improve

## 2020-04-18 ENCOUNTER — Ambulatory Visit (INDEPENDENT_AMBULATORY_CARE_PROVIDER_SITE_OTHER): Payer: Managed Care, Other (non HMO) | Admitting: Family Medicine

## 2020-04-18 ENCOUNTER — Encounter: Payer: Self-pay | Admitting: Family Medicine

## 2020-04-18 ENCOUNTER — Other Ambulatory Visit: Payer: Self-pay

## 2020-04-18 VITALS — BP 110/56 | HR 79 | Temp 98.3°F | Resp 18 | Ht 68.0 in | Wt 195.0 lb

## 2020-04-18 DIAGNOSIS — Z0000 Encounter for general adult medical examination without abnormal findings: Secondary | ICD-10-CM | POA: Insufficient documentation

## 2020-04-18 DIAGNOSIS — Z87898 Personal history of other specified conditions: Secondary | ICD-10-CM | POA: Diagnosis not present

## 2020-04-18 NOTE — Assessment & Plan Note (Signed)
Annual physical exam without new findings.  Well adult with no acute concerns.  Plan: 1. Obtain health maintenance screenings as above according to age. - Increase physical activity to 30 minutes most days of the week.  - Eat healthy diet high in vegetables and fruits; low in refined carbohydrates. - Screening labs and tests as ordered 2. Return 1 year for annual physical.  

## 2020-04-18 NOTE — Patient Instructions (Addendum)
For Mammogram screening for breast cancer   Call the [facility] below anytime to schedule your own appointment now that order has been placed.  [facility] [place], [place] [address] Phone: [phone]  [facility] [address] [place], [place] [address] Phone: [phone]  Well Visit: Care Instructions Overview  Well visits can help you stay healthy. Your provider has checked your overall health and may have suggested ways to take good care of yourself. Your provider also may have recommended tests. At home, you can help prevent illness with healthy eating, regular exercise, and other steps.  Follow-up care is a key part of your treatment and safety. Be sure to make and go to all appointments, and call your provider if you are having problems. It's also a good idea to know your test results and keep a list of the medicines you take.  How can you care for yourself at home?   Get screening tests that you and your doctor decide on. Screening helps find diseases before any symptoms appear.   Eat healthy foods. Choose fruits, vegetables, whole grains, protein, and low-fat dairy foods. Limit fat, especially saturated fat. Reduce salt in your diet.   Limit alcohol. If you are a man, have no more than 2 drinks a day or 14 drinks a week. If you are a woman, have no more than 1 drink a day or 7 drinks a week.   Get at least 30 minutes of physical activity on most days of the week.  We recommend you go no more than 2 days in a row without exercise. Walking is a good choice. You also may want to do other activities, such as running, swimming, cycling, or playing tennis or team sports. Discuss any changes in your exercise program with your provider.   Reach and stay at a healthy weight. This will lower your risk for many problems, such as obesity, diabetes, heart disease, and high blood pressure.   Do  not smoke or allow others to smoke around you. If you need help quitting, talk to your provider about stop-smoking programs and medicines. These can increase your chances of quitting for good.  Can call 1-800-QUIT-NOW 5643955923) for the [facility], assistance with smoking cessation.   Care for your mental health. It is easy to get weighed down by worry and stress. Learn strategies to manage stress, like deep breathing and mindfulness, and stay connected with your family and community. If you find you often feel sad or hopeless, talk with your provider. Treatment can help.   Talk to your provider about whether you have any risk factors for sexually transmitted infections (STIs). You can help prevent STIs if you wait to have sex with a new partner (or partners) until you've each been tested for STIs. It also helps if you use condoms (female or female condoms) and if you limit your sex partners to one person who only has sex with you. Vaccines are available for some STIs, such as HPV (these are age dependent).   Use birth control if it's important to you to prevent pregnancy. Talk with your provider about the choices available and what might be best for you.   If you think you may have a problem with alcohol or drug use, talk to your provider. This includes prescription medicines (such as amphetamines and opioids) and illegal drugs (such as cocaine and methamphetamine). Your provider can help you figure  out what type of treatment is best for you.   If you have concerns about domestic violence or intimate partner violence, there are resources available to you. National Domestic Abuse Hotline 3035551206   Protect your skin from too much sun. When you're outdoors from 10 a.m. to 4 p.m., stay in the shade or cover up with clothing and a hat with a wide brim. Wear sunglasses that block UV rays. Even when it's cloudy, put broad-spectrum sunscreen (SPF 30 or higher) on any exposed  skin.   See a dentist one or two times a year for checkups and to have your teeth cleaned.   See an eye doctor once per year for an eye exam.   Wear a seat belt in the car.  When should you call for help?  Watch closely for changes in your health, and be sure to contact your provider if you have any problems or symptoms that concern you.  We will plan to see you back in 12 months for your physical  You will receive a survey after today's visit either digitally by e-mail or paper by [place] mail. Your experiences and feedback matter to [place].  Please respond so we know how we are doing as we provide care for you.  Call us with any questions/concerns/needs.  It is my goal to be available to you for your health concerns.  Thanks for choosing me to be a partner in your healthcare needs!  Harlin Rain, FNP-C Family Nurse Practitioner [facility] Phone: [phone]

## 2020-04-18 NOTE — Assessment & Plan Note (Signed)
Patient with mammogram order early 2021, reports called to schedule and was notified would need a diagnostic mammogram due to history of breast lumps.  Ordered diagnostic mammogram and screening mammogram still on file.  Plan: 1. Schedule mammogram

## 2020-04-18 NOTE — Progress Notes (Signed)
Subjective:    Patient ID: Kelsey Key, female    DOB: 11/30/1960, 59 y.o.   MRN: [id number]  Kelsey Key is a 59 y.o. female presenting on 04/18/2020 for Annual Exam   HPI  HEALTH MAINTENANCE:  Weight/BMI: Overweight, BMI 29.65% Physical activity: Stays active Diet: Regular Seatbelt: Always Sunscreen: As needed Mammogram: Due, order placed, reports imaging center requested as diagnostic and was unable to schedule screening at last order for Mammo this year DEXA: Completed 05/13/2017 Colon cancer screening: Completed 2016 with [facility], records requested  GC/CT: Offered and declined Optometry: Every 2 years Dentistry: Every 6 months  IMMUNIZATIONS: Influenza: Due this season, undecided Tetanus: Up to date 04/03/2012 COVID: Up to date, [place] 10/28/2019, 11/18/2019 Pneumonia: Pneumococcal 13 + 23  STOP BANG SCREENING Snoring: Do you snore loudly? yes/no: No Tired: Do you often feel tired, fatigued, sleeping during the daytime? yes/no: No Observed: Has anyone observed you stop breathing or choking/gasping during your sleep? yes/no: No Pressure: Do you have or being treated for high blood pressure? yes/no: No BMI: Greater than 35? yes/no: No Age: Older than 50? yes/no: Yes Neck Side: Greater than 17 (males)/16 (females)? yes/no: No Gender: Born as female gender? yes/no: No  Depression screen [facility] 2/9 12/28/2019 08/30/2019 02/23/2019  Decreased Interest 0 0 0  Down, Depressed, Hopeless 0 0 0  PHQ - 2 Score 0 0 0  Altered sleeping 0 0 0  Tired, decreased energy 1 0 0  Change in appetite 0 0 0  Feeling bad or failure about yourself  0 0 0  Trouble concentrating 0 0 0  Moving slowly or fidgety/restless 0 0 0  Suicidal thoughts 0 0 0  PHQ-9 Score 1 0 0  Difficult doing work/chores Not difficult at all Not difficult at all Not difficult at all    Past Medical History:  Diagnosis Date  . Anxiety   . Hyperlipidemia   . Osteopenia 03/27/2017   Past Surgical History:   Procedure Laterality Date  . ABDOMINAL HYSTERECTOMY  1991   for endometriosis - no cervical ca  . CHOLECYSTECTOMY    . Oophrectomy   1997  . SPLENECTOMY, TOTAL     Social History   Socioeconomic History  . Marital status: Married    Spouse name: Not on file  . Number of children: Not on file  . Years of education: Not on file  . Highest education level: Not on file  Occupational History  . Not on file  Tobacco Use  . Smoking status: Current Every Day Smoker    Packs/day: 0.50    Years: 30.00    Pack years: 15.00    Types: Cigarettes  . Smokeless tobacco: Never Used  Vaping Use  . Vaping Use: Never used  Substance and Sexual Activity  . Alcohol use: No  . Drug use: No  . Sexual activity: Yes    Birth control/protection: None    Comment: mutually monogamous long-term relationship  Other Topics Concern  . Not on file  Social History Narrative  . Not on file   Social Determinants of Health   Financial Resource Strain:   . Difficulty of Paying Living Expenses: Not on file  Food Insecurity:   . Worried About Charity fundraiser in the Last Year: Not on file  . Ran Out of Food in the Last Year: Not on file  Transportation Needs:   . Lack of Transportation (Medical): Not on file  . Lack of Transportation (Non-Medical):  Not on file  Physical Activity:   . Days of Exercise per Week: Not on file  . Minutes of Exercise per Session: Not on file  Stress:   . Feeling of Stress : Not on file  Social Connections:   . Frequency of Communication with Friends and Family: Not on file  . Frequency of Social Gatherings with Friends and Family: Not on file  . Attends Religious Services: Not on file  . Active Member of Clubs or Organizations: Not on file  . Attends Archivist Meetings: Not on file  . Marital Status: Not on file  Intimate Partner Violence:   . Fear of Current or Ex-Partner: Not on file  . Emotionally Abused: Not on file  . Physically Abused: Not on  file  . Sexually Abused: Not on file   Family History  Problem Relation Age of Onset  . Diabetes Mother   . Osteoporosis Mother   . Diabetes Father   . Hypertension Brother   . Throat cancer Maternal Grandmother   . Healthy Brother   . Heart attack Neg Hx   . Stroke Neg Hx   . Breast cancer Neg Hx   . Colon cancer Neg Hx   . Ovarian cancer Neg Hx    Current Outpatient Medications on File Prior to Visit  Medication Sig  . albuterol (VENTOLIN HFA) 108 (90 Base) MCG/ACT inhaler Inhale 2 puffs into the lungs every 6 (six) hours as needed for wheezing or shortness of breath.  Marland Kitchen atorvastatin (LIPITOR) 40 MG tablet Take 1 tablet (40 mg total) by mouth daily.  . calcium-vitamin D (OSCAL WITH D) 250-125 MG-UNIT tablet Take 1 tablet by mouth daily.  . cetirizine (ZYRTEC) 10 MG tablet Take 1 tablet (10 mg total) by mouth daily.  Marland Kitchen docusate sodium (COLACE) 100 MG capsule Take 1 capsule (100 mg total) by mouth daily.  Marland Kitchen doxycycline (VIBRA-TABS) 100 MG tablet Take 1 tablet (100 mg total) by mouth 2 (two) times daily.  . fluticasone (FLONASE) 50 MCG/ACT nasal spray Place 2 sprays into both nostrils daily.  . montelukast (SINGULAIR) 10 MG tablet Take 1 tablet (10 mg total) by mouth at bedtime.  . Multiple Vitamin (MULTIVITAMIN) capsule Take by mouth.  . Omega-3 Fatty Acids (FISH OIL) 1200 MG CAPS Take by mouth.  . predniSONE (DELTASONE) 20 MG tablet Take 2 tablets (40 mg total) by mouth daily with breakfast for 5 days.  . Probiotic CAPS   . sertraline (ZOLOFT) 50 MG tablet Take 1 tablet (50 mg total) by mouth daily.  . TURMERIC PO Take by mouth.  . vitamin E 400 UNIT capsule Take 400 Units by mouth daily.  Marland Kitchen ALPRAZolam (XANAX) 0.25 MG tablet Take 1 tablet (0.25 mg total) by mouth 2 (two) times daily as needed for anxiety. (Patient not taking: Reported on 04/18/2020)   No current facility-administered medications on file prior to visit.    Per HPI unless specifically indicated above       Objective:    BP (!) 110/56 (BP Location: Right Arm, Patient Position: Sitting, Cuff Size: Normal)   Pulse 79   Temp 98.3 F (36.8 C) (Oral)   Resp 18   Ht 5' 8"  (1.727 m)   Wt 195 lb (88.5 kg)   SpO2 98%   BMI 29.65 kg/m   Wt Readings from Last 3 Encounters:  04/18/20 195 lb (88.5 kg)  12/28/19 194 lb 12.8 oz (88.4 kg)  10/14/18 191 lb 9.6 oz (86.9 kg)  Physical Exam Vitals reviewed.  Constitutional:      General: She is not in acute distress.    Appearance: Normal appearance. She is well-developed, well-groomed and overweight. She is not ill-appearing or toxic-appearing.  HENT:     Head: Normocephalic and atraumatic.     Right Ear: Tympanic membrane, ear canal and external ear normal. There is no impacted cerumen.     Left Ear: Tympanic membrane, ear canal and external ear normal. There is no impacted cerumen.     Nose: Nose normal. No congestion or rhinorrhea.     Mouth/Throat:     Lips: Pink.     Mouth: Mucous membranes are moist.     Pharynx: Oropharynx is clear. Uvula midline. No oropharyngeal exudate or posterior oropharyngeal erythema.  Eyes:     General: Lids are normal. Vision grossly intact. No scleral icterus.       Right eye: No discharge.        Left eye: No discharge.     Extraocular Movements: Extraocular movements intact.     Conjunctiva/sclera: Conjunctivae normal.     Pupils: Pupils are equal, round, and reactive to light.  Neck:     Thyroid: No thyroid mass or thyromegaly.  Cardiovascular:     Rate and Rhythm: Normal rate and regular rhythm.     Pulses: Normal pulses.          Dorsalis pedis pulses are 2+ on the right side and 2+ on the left side.     Heart sounds: Normal heart sounds. No murmur heard.  No friction rub. No gallop.   Pulmonary:     Effort: Pulmonary effort is normal. No respiratory distress.     Breath sounds: Normal breath sounds.  Abdominal:     General: Abdomen is flat. Bowel sounds are normal. There is no distension.      Palpations: Abdomen is soft. There is no hepatomegaly or mass.     Tenderness: There is no abdominal tenderness. There is no guarding or rebound.     Hernia: No hernia is present.     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     Cervical back: Normal range of motion and neck supple. No tenderness.     Right lower leg: No edema.     Left lower leg: No edema.     Comments: Normal tone, strength 5/5 BUE & BLE  Feet:     Right foot:     Skin integrity: Skin integrity normal.     Left foot:     Skin integrity: Skin integrity normal.  Lymphadenopathy:     Cervical: No cervical adenopathy.  Skin:    General: Skin is warm and dry.     Capillary Refill: Capillary refill takes less than 2 seconds.  Neurological:     General: No focal deficit present.     Mental Status: She is alert and oriented to person, place, and time.     Cranial Nerves: No cranial nerve deficit.     Sensory: No sensory deficit.     Motor: No weakness.     Coordination: Coordination normal.     Gait: Gait normal.     Deep Tendon Reflexes: Reflexes normal.  Psychiatric:        Attention and Perception: Attention and perception normal.        Mood and Affect: Mood and affect normal.  Speech: Speech normal.        Behavior: Behavior normal. Behavior is cooperative.        Thought Content: Thought content normal.        Cognition and Memory: Cognition and memory normal.        Judgment: Judgment normal.     Results for orders placed or performed in visit on 12/28/19  CBC with Differential  Result Value Ref Range   WBC 10.3 3.4 - 10.8 x10E3/uL   RBC 4.46 3.77 - 5.28 x10E6/uL   Hemoglobin 13.8 11.1 - 15.9 g/dL   Hematocrit 42.0 34.0 - 46.6 %   MCV 94 79 - 97 fL   MCH 30.9 26.6 - 33.0 pg   MCHC 32.9 31 - 35 g/dL   RDW 13.1 11.7 - 15.4 %   Platelets 457 (H) 150 - 450 x10E3/uL   Neutrophils 53 Not Estab. %   Lymphs 36 Not Estab. %   Monocytes 9 Not  Estab. %   Eos 1 Not Estab. %   Basos 1 Not Estab. %   Neutrophils Absolute 5.5 1 - 7 x10E3/uL   Lymphocytes Absolute 3.7 (H) 0 - 3 x10E3/uL   Monocytes Absolute 1.0 (H) 0 - 0 x10E3/uL   EOS (ABSOLUTE) 0.1 0.0 - 0.4 x10E3/uL   Basophils Absolute 0.1 0 - 0 x10E3/uL   Immature Granulocytes 0 Not Estab. %   Immature Grans (Abs) 0.0 0.0 - 0.1 x10E3/uL  CMP14+EGFR  Result Value Ref Range   Glucose 99 65 - 99 mg/dL   BUN 10 8 - 27 mg/dL   Creatinine, Ser 0.83 0.57 - 1.00 mg/dL   GFR calc non Af Amer 77 >59 mL/min/1.73   GFR calc Af Amer 89 >59 mL/min/1.73   BUN/Creatinine Ratio 12 12 - 28   Sodium 140 134 - 144 mmol/L   Potassium 4.9 3.5 - 5.2 mmol/L   Chloride 105 96 - 106 mmol/L   CO2 24 20 - 29 mmol/L   Calcium 9.5 8.7 - 10.3 mg/dL   Total Protein 6.6 6.0 - 8.5 g/dL   Albumin 4.3 3.8 - 4.9 g/dL   Globulin, Total 2.3 1.5 - 4.5 g/dL   Albumin/Globulin Ratio 1.9 1.2 - 2.2   Bilirubin Total 0.4 0.0 - 1.2 mg/dL   Alkaline Phosphatase 73 48 - 121 IU/L   AST 16 0 - 40 IU/L   ALT 16 0 - 32 IU/L  Lipid Profile  Result Value Ref Range   Cholesterol, Total 142 100 - 199 mg/dL   Triglycerides 121 0 - 149 mg/dL   HDL 47 >39 mg/dL   VLDL Cholesterol Cal 22 5 - 40 mg/dL   LDL Chol Calc (NIH) 73 0 - 99 mg/dL   Chol/HDL Ratio 3.0 0.0 - 4.4 ratio  Thyroid Panel With TSH  Result Value Ref Range   TSH 2.350 0.450 - 4.500 uIU/mL   T4, Total 6.8 4.5 - 12.0 ug/dL   T3 Uptake Ratio 24 24 - 39 %   Free Thyroxine Index 1.6 1.2 - 4.9  POCT Urinalysis Dipstick  Result Value Ref Range   Color, UA Yellow    Clarity, UA clear    Glucose, UA Negative Negative   Bilirubin, UA negative    Ketones, UA negative    Spec Grav, UA 1.015 1.010 - 1.025   Blood, UA negative    pH, UA 5.0 5.0 - 8.0   Protein, UA Negative Negative   Urobilinogen, UA 0.2 0.2 or 1.0 E.U./dL  Nitrite, UA negative    Leukocytes, UA Negative Negative   Appearance     Odor        Assessment & Plan:   Problem List Items  Addressed This Visit      Other   Annual physical exam - Primary    Annual physical exam without new findings.  Well adult with no acute concerns.  Plan: 1. Obtain health maintenance screenings as above according to age. - Increase physical activity to 30 minutes most days of the week.  - Eat healthy diet high in vegetables and fruits; low in refined carbohydrates. - Screening labs and tests as ordered 2. Return 1 year for annual physical.       Hx of breast lump    Patient with mammogram order early 2021, reports called to schedule and was notified would need a diagnostic mammogram due to history of breast lumps.  Ordered diagnostic mammogram and screening mammogram still on file.  Plan: 1. Schedule mammogram      Relevant Orders   MM DIAG BREAST TOMO BILATERAL      No orders of the defined types were placed in this encounter.  Follow up plan: Return in about 1 year (around 04/18/2021) for CPE.  Harlin Rain, FNP-C Family Nurse Practitioner [facility] 04/18/2020, 4:00 PM

## 2020-05-31 ENCOUNTER — Encounter: Payer: Self-pay | Admitting: Family Medicine

## 2020-06-07 ENCOUNTER — Inpatient Hospital Stay
Admission: RE | Admit: 2020-06-07 | Discharge: 2020-06-07 | Disposition: A | Discharge status: Home / Self Care | Payer: Self-pay | Source: Ambulatory Visit | Attending: *Deleted | Admitting: *Deleted

## 2020-06-07 ENCOUNTER — Other Ambulatory Visit: Payer: Self-pay | Admitting: *Deleted

## 2020-06-07 DIAGNOSIS — Z1231 Encounter for screening mammogram for malignant neoplasm of breast: Secondary | ICD-10-CM

## 2020-06-08 ENCOUNTER — Other Ambulatory Visit: Payer: Self-pay | Admitting: Family Medicine

## 2020-06-08 DIAGNOSIS — Z87898 Personal history of other specified conditions: Secondary | ICD-10-CM

## 2020-06-09 LAB — HM MAMMOGRAPHY

## 2020-06-28 ENCOUNTER — Encounter: Payer: Self-pay | Admitting: Family Medicine

## 2020-06-28 ENCOUNTER — Other Ambulatory Visit: Payer: Self-pay | Admitting: Family Medicine

## 2020-06-28 DIAGNOSIS — F418 Other specified anxiety disorders: Secondary | ICD-10-CM

## 2020-06-28 DIAGNOSIS — F410 Panic disorder [episodic paroxysmal anxiety] without agoraphobia: Secondary | ICD-10-CM

## 2020-06-28 MED ORDER — ALPRAZOLAM 0.25 MG PO TABS: 0.2500 mg | ORAL_TABLET | Freq: Two times a day (BID) | ORAL | 1 refills | Status: DC | PRN

## 2020-06-28 NOTE — Telephone Encounter (Signed)
Please Advise.  KP

## 2020-07-26 ENCOUNTER — Other Ambulatory Visit: Payer: Self-pay

## 2020-07-26 ENCOUNTER — Encounter: Payer: Self-pay | Admitting: Family Medicine

## 2020-07-26 ENCOUNTER — Ambulatory Visit (INDEPENDENT_AMBULATORY_CARE_PROVIDER_SITE_OTHER): Payer: Managed Care, Other (non HMO) | Admitting: Family Medicine

## 2020-07-26 ENCOUNTER — Ambulatory Visit: Payer: Self-pay | Admitting: *Deleted

## 2020-07-26 VITALS — BP 120/62 | HR 80 | Temp 98.3°F | Resp 18 | Ht 68.0 in | Wt 191.0 lb

## 2020-07-26 DIAGNOSIS — L240 Irritant contact dermatitis due to detergents: Secondary | ICD-10-CM | POA: Diagnosis not present

## 2020-07-26 MED ORDER — PREDNISONE 20 MG PO TABS: 40.0000 mg | ORAL_TABLET | Freq: Every day | ORAL | 0 refills | Status: AC

## 2020-07-26 NOTE — Telephone Encounter (Signed)
Patient is calling to report she has rash on chest- she recently had lumpectomy and got sports bras to wear- she is having rash under breast, shoulders, between breast- she thinks she didn't rinse them well enough when she washed them. Patient states she has never had latex allergy. Patient states she has tried Benadryl, allergy medication, and triamcinolone creme without success. Reason for Disposition . [1] Severe localized itching AND [2] after 2 days of steroid cream  Answer Assessment - Initial Assessment Questions 1. APPEARANCE of RASH: "Describe the rash."      Hives-red 2. LOCATION: "Where is the rash located?"      Bra elastic areas 3. NUMBER: "How many spots are there?"      numerous 4. SIZE: "How big are the spots?" (Inches, centimeters or compare to size of a coin)      Hive like 5. ONSET: "When did the rash start?"      startedSunday 6. ITCHING: "Does the rash itch?" If Yes, ask: "How bad is the itch?"  (Scale 1-10; or mild, moderate, severe)     Yes- moderate 7. PAIN: "Does the rash hurt?" If Yes, ask: "How bad is the pain?"  (Scale 1-10; or mild, moderate, severe)     no 8. OTHER SYMPTOMS: "Do you have any other symptoms?" (e.g., fever)     no 9. PREGNANCY: "Is there any chance you are pregnant?" "When was your last menstrual period?"     n/a  Protocols used: RASH OR REDNESS - LOCALIZED-A-AH

## 2020-07-26 NOTE — Progress Notes (Signed)
Subjective:    Patient ID: Kelsey Key, female    DOB: 01-04-1961, 59 y.o.   MRN: [id number]  Kelsey Key is a 59 y.o. female presenting on 07/26/2020 for Rash (pt had a lumectomy x 5 days ago. She purchased a new surgical bra that zips in the front. She notice after washing the bras and wearing them again she developed a rash on her chest only where the bra is located. Redness, blotchy and mild itching x 3 days Pt currently treating the rash with Triamcinolone topical cream )   HPI  Kelsey Key presents to clinic for concerns of rash.  Reports having had a lumpectomy approximately 5 days ago, had purchased a new surgical bra that zips up in the front, had hand washed it in detergent and downey but believes may not have washed all of the detergent out of the bra before wearing it.  Has had redness, blotchy skin and mild itching x 3 days.  Has been applying triamcinolone cream without improvement in symptoms.  Denies any other areas of rash/concern  Depression screen [facility] 2/9 12/28/2019 08/30/2019 02/23/2019  Decreased Interest 0 0 0  Down, Depressed, Hopeless 0 0 0  PHQ - 2 Score 0 0 0  Altered sleeping 0 0 0  Tired, decreased energy 1 0 0  Change in appetite 0 0 0  Feeling bad or failure about yourself  0 0 0  Trouble concentrating 0 0 0  Moving slowly or fidgety/restless 0 0 0  Suicidal thoughts 0 0 0  PHQ-9 Score 1 0 0  Difficult doing work/chores Not difficult at all Not difficult at all Not difficult at all    Social History   Tobacco Use  . Smoking status: Current Every Day Smoker    Packs/day: 0.50    Years: 30.00    Pack years: 15.00    Types: Cigarettes  . Smokeless tobacco: Never Used  Vaping Use  . Vaping Use: Never used  Substance Use Topics  . Alcohol use: No  . Drug use: No    Review of Systems  Constitutional: Negative.   HENT: Negative.   Eyes: Negative.   Respiratory: Negative.   Cardiovascular: Negative.   Gastrointestinal: Negative.   Endocrine:  Negative.   Genitourinary: Negative.   Musculoskeletal: Negative.   Skin: Positive for rash. Negative for color change, pallor and wound.  Allergic/Immunologic: Negative.   Neurological: Negative.   Hematological: Negative.   Psychiatric/Behavioral: Negative.    Per HPI unless specifically indicated above     Objective:    BP 120/62 (BP Location: Right Arm, Patient Position: Sitting, Cuff Size: Normal)   Pulse 80   Temp 98.3 F (36.8 C) (Oral)   Resp 18   Ht 5' 8"  (1.727 m)   Wt 191 lb (86.6 kg)   SpO2 100%   BMI 29.04 kg/m   Wt Readings from Last 3 Encounters:  07/26/20 191 lb (86.6 kg)  04/18/20 195 lb (88.5 kg)  12/28/19 194 lb 12.8 oz (88.4 kg)    Physical Exam Vitals and nursing note reviewed.  Constitutional:      General: She is not in acute distress.    Appearance: Normal appearance. She is well-developed and well-groomed. She is not ill-appearing or toxic-appearing.  HENT:     Head: Normocephalic and atraumatic.     Nose:     Comments: Kelsey Key is in place, covering mouth and nose. Eyes:     General: Lids are normal. Vision grossly  intact.        Right eye: No discharge.        Left eye: No discharge.     Extraocular Movements: Extraocular movements intact.     Conjunctiva/sclera: Conjunctivae normal.     Pupils: Pupils are equal, round, and reactive to light.  Cardiovascular:     Rate and Rhythm: Normal rate and regular rhythm.     Pulses: Normal pulses.     Heart sounds: Normal heart sounds. No murmur heard.  No friction rub. No gallop.   Pulmonary:     Effort: Pulmonary effort is normal. No respiratory distress.     Breath sounds: Normal breath sounds.  Skin:    General: Skin is warm and dry.     Capillary Refill: Capillary refill takes less than 2 seconds.     Findings: Rash present. Rash is papular.     Comments: Papular rash along the line of sports bra, and throughout inner area of sports bra.  Reddened, no sign of infection/weeping/drainage.   Neurological:     General: No focal deficit present.     Mental Status: She is alert and oriented to person, place, and time.  Psychiatric:        Attention and Perception: Attention and perception normal.        Mood and Affect: Mood and affect normal.        Speech: Speech normal.        Behavior: Behavior normal. Behavior is cooperative.        Thought Content: Thought content normal.        Cognition and Memory: Cognition and memory normal.        Judgment: Judgment normal.    Results for orders placed or performed in visit on 12/28/19  CBC with Differential  Result Value Ref Range   WBC 10.3 3.4 - 10.8 x10E3/uL   RBC 4.46 3.77 - 5.28 x10E6/uL   Hemoglobin 13.8 11.1 - 15.9 g/dL   Hematocrit 42.0 34.0 - 46.6 %   MCV 94 79 - 97 fL   MCH 30.9 26.6 - 33.0 pg   MCHC 32.9 31 - 35 g/dL   RDW 13.1 11.7 - 15.4 %   Platelets 457 (H) 150 - 450 x10E3/uL   Neutrophils 53 Not Estab. %   Lymphs 36 Not Estab. %   Monocytes 9 Not Estab. %   Eos 1 Not Estab. %   Basos 1 Not Estab. %   Neutrophils Absolute 5.5 1.40 - 7.00 x10E3/uL   Lymphocytes Absolute 3.7 (H) 0 - 3 x10E3/uL   Monocytes Absolute 1.0 (H) 0 - 0 x10E3/uL   EOS (ABSOLUTE) 0.1 0.0 - 0.4 x10E3/uL   Basophils Absolute 0.1 0 - 0 x10E3/uL   Immature Granulocytes 0 Not Estab. %   Immature Grans (Abs) 0.0 0.0 - 0.1 x10E3/uL  CMP14+EGFR  Result Value Ref Range   Glucose 99 65 - 99 mg/dL   BUN 10 8 - 27 mg/dL   Creatinine, Ser 0.83 0.57 - 1.00 mg/dL   GFR calc non Af Amer 77 >59 mL/min/1.73   GFR calc Af Amer 89 >59 mL/min/1.73   BUN/Creatinine Ratio 12 12 - 28   Sodium 140 134 - 144 mmol/L   Potassium 4.9 3.5 - 5.2 mmol/L   Chloride 105 96 - 106 mmol/L   CO2 24 20 - 29 mmol/L   Calcium 9.5 8.7 - 10.3 mg/dL   Total Protein 6.6 6.0 - 8.5 g/dL   Albumin 4.3 3.8 -  4.9 g/dL   Globulin, Total 2.3 1.5 - 4.5 g/dL   Albumin/Globulin Ratio 1.9 1.2 - 2.2   Bilirubin Total 0.4 0.0 - 1.2 mg/dL   Alkaline Phosphatase 73 48 - 121  IU/L   AST 16 0 - 40 IU/L   ALT 16 0 - 32 IU/L  Lipid Profile  Result Value Ref Range   Cholesterol, Total 142 100 - 199 mg/dL   Triglycerides 121 0 - 149 mg/dL   HDL 47 >39 mg/dL   VLDL Cholesterol Cal 22 5 - 40 mg/dL   LDL Chol Calc (NIH) 73 0 - 99 mg/dL   Chol/HDL Ratio 3.0 0.0 - 4.4 ratio  Thyroid Panel With TSH  Result Value Ref Range   TSH 2.350 0.450 - 4.500 uIU/mL   T4, Total 6.8 4.5 - 12.0 ug/dL   T3 Uptake Ratio 24 24 - 39 %   Free Thyroxine Index 1.6 1.2 - 4.9  POCT Urinalysis Dipstick  Result Value Ref Range   Color, UA Yellow    Clarity, UA clear    Glucose, UA Negative Negative   Bilirubin, UA negative    Ketones, UA negative    Spec Grav, UA 1.015 1.010 - 1.025   Blood, UA negative    pH, UA 5.0 5.0 - 8.0   Protein, UA Negative Negative   Urobilinogen, UA 0.2 0.2 or 1.0 E.U./dL   Nitrite, UA negative    Leukocytes, UA Negative Negative   Appearance     Odor        Assessment & Plan:   Problem List Items Addressed This Visit      Musculoskeletal and Integument   Irritant contact dermatitis due to detergent - Primary    Contact dermatitis due to detergent, not responsive to topical ointments.  Will treat with prednisone 69m daily x 5 days.  Discussed prednisone can lead to delayed healing, and if has any concerns regarding healing from her lumpectomy, to discuss with her breast surgeon, that it is just a 5 day burst should not interrupt her current progress with her healing.  Verbalized understanding and denied additional questions.      Relevant Medications   predniSONE (DELTASONE) 20 MG tablet      Meds ordered this encounter  Medications  . predniSONE (DELTASONE) 20 MG tablet    Sig: Take 2 tablets (40 mg total) by mouth daily with breakfast for 5 days.    Dispense:  10 tablet    Refill:  0    Follow up plan: Return if symptoms worsen or fail to improve.   NHarlin Rain FWellingtonFamily Nurse Practitioner [facility] 07/26/2020, 11:05 AM

## 2020-07-26 NOTE — Assessment & Plan Note (Signed)
Contact dermatitis due to detergent, not responsive to topical ointments.  Will treat with prednisone 40mg  daily x 5 days.  Discussed prednisone can lead to delayed healing, and if has any concerns regarding healing from her lumpectomy, to discuss with her breast surgeon, that it is just a 5 day burst should not interrupt her current progress with her healing.  Verbalized understanding and denied additional questions.

## 2020-07-26 NOTE — Patient Instructions (Signed)
I have sent in a prescription for prednisone 40mg  to take daily for the next 5 days.  Can take over the counter bendaryl, according to packaging directions, as needed for skin itching/irritation  As we discussed, steroids can work towards delayed wound healing.  If you have any concerns regarding your incision to please contact your breast surgeon.  We will plan to see you back if your symptoms worsen or fail to improve  You will receive a survey after today's visit either digitally by e-mail or paper by USPS mail. Your experiences and feedback matter to [place].  Please respond so we know how we are doing as we provide care for you.  Call us with any questions/concerns/needs.  It is my goal to be available to you for your health concerns.  Thanks for choosing me to be a partner in your healthcare needs!  Harlin Rain, FNP-C Family Nurse Practitioner [facility] Phone: [phone]

## 2020-08-19 HISTORY — PX: BREAST LUMPECTOMY: SHX2

## 2020-09-13 ENCOUNTER — Telehealth: Payer: Managed Care, Other (non HMO) | Admitting: Family

## 2020-09-13 DIAGNOSIS — J019 Acute sinusitis, unspecified: Secondary | ICD-10-CM

## 2020-09-13 MED ORDER — AMOXICILLIN-POT CLAVULANATE 875-125 MG PO TABS: 1.0000 | ORAL_TABLET | Freq: Two times a day (BID) | ORAL | 0 refills | Status: DC

## 2020-09-13 NOTE — Progress Notes (Signed)
We are sorry that you are not feeling well.  Here is how we plan to help!  Based on what you have shared with me it looks like you have sinusitis.  Sinusitis is inflammation and infection in the sinus cavities of the head.  Based on your presentation I believe you most likely have Acute Bacterial Sinusitis.  This is an infection caused by bacteria and is treated with antibiotics. I have prescribed Augmentin 875mg /125mg  one tablet twice daily with food, for 7 days. You may use an oral decongestant such as Mucinex D or if you have glaucoma or high blood pressure use plain Mucinex. Saline nasal spray help and can safely be used as often as needed for congestion.  If you develop worsening sinus pain, fever or notice severe headache and vision changes, or if symptoms are not better after completion of antibiotic, please schedule an appointment with a health care provider.    Sinus infections are not as easily transmitted as other respiratory infection, however we still recommend that you avoid close contact with loved ones, especially the very young and elderly.  Remember to wash your hands thoroughly throughout the day as this is the number one way to prevent the spread of infection!  Home Care:  Only take medications as instructed by your medical team.  Complete the entire course of an antibiotic.  Do not take these medications with alcohol.  A steam or ultrasonic humidifier can help congestion.  You can place a towel over your head and breathe in the steam from hot water coming from a faucet.  Avoid close contacts especially the very young and the elderly.  Cover your mouth when you cough or sneeze.  Always remember to wash your hands.  Get Help Right Away If:  You develop worsening fever or sinus pain.  You develop a severe head ache or visual changes.  Your symptoms persist after you have completed your treatment plan.  Make sure you  Understand these instructions.  Will watch your  condition.  Will get help right away if you are not doing well or get worse.  Your e-visit answers were reviewed by a board certified advanced clinical practitioner to complete your personal care plan.  Depending on the condition, your plan could have included both over the counter or prescription medications.  If there is a problem please reply  once you have received a response from your provider.  Your safety is important to [place].  If you have drug allergies check your prescription carefully.    You can use MyChart to ask questions about today's visit, request a non-urgent call back, or ask for a work or school excuse for 24 hours related to this e-Visit. If it has been greater than 24 hours you will need to follow up with your provider, or enter a new e-Visit to address those concerns.  You will get an e-mail in the next two days asking about your experience.  I hope that your e-visit has been valuable and will speed your recovery. Thank you for using e-visits.  Approximately 5 minutes was spent documenting and reviewing patient's chart.    We will go ahead and treat with antibiotics given breast cancer.

## 2020-09-25 ENCOUNTER — Other Ambulatory Visit: Payer: Self-pay

## 2020-09-25 DIAGNOSIS — E785 Hyperlipidemia, unspecified: Secondary | ICD-10-CM

## 2020-09-25 MED ORDER — ATORVASTATIN CALCIUM 40 MG PO TABS: 40.0000 mg | ORAL_TABLET | Freq: Every day | ORAL | 1 refills | Status: DC

## 2020-10-09 ENCOUNTER — Other Ambulatory Visit: Payer: Self-pay

## 2020-10-09 DIAGNOSIS — F418 Other specified anxiety disorders: Secondary | ICD-10-CM

## 2020-10-09 DIAGNOSIS — F410 Panic disorder [episodic paroxysmal anxiety] without agoraphobia: Secondary | ICD-10-CM

## 2020-10-09 MED ORDER — SERTRALINE HCL 50 MG PO TABS: 50.0000 mg | ORAL_TABLET | Freq: Every day | ORAL | 1 refills | Status: DC

## 2020-10-10 ENCOUNTER — Other Ambulatory Visit: Payer: Self-pay

## 2020-12-26 ENCOUNTER — Other Ambulatory Visit: Payer: Self-pay | Admitting: Family Medicine

## 2020-12-26 DIAGNOSIS — Z9109 Other allergy status, other than to drugs and biological substances: Secondary | ICD-10-CM

## 2020-12-26 NOTE — Telephone Encounter (Signed)
Requested Prescriptions  Pending Prescriptions Disp Refills  . montelukast (SINGULAIR) 10 MG tablet [Pharmacy Med Name: MONTELUKAST SODIUM TABS 10MG ] 90 tablet 0    Sig: TAKE 1 TABLET AT BEDTIME     Pulmonology:  Leukotriene Inhibitors Passed - 12/26/2020  5:42 PM      Passed - Valid encounter within last 12 months    Recent Outpatient Visits          5 months ago Irritant contact dermatitis due to [place], FNP   8 months ago Annual physical exam   [facility], Lupita Raider, FNP   8 months ago Pneumonia due to infectious organism, unspecified laterality, unspecified part of lung   [place], FNP   12 months ago Encounter for screening mammogram for malignant neoplasm of breast   [place], FNP   1 year ago Acute recurrent frontal sinusitis   [place], Devonne Doughty, DO

## 2020-12-27 ENCOUNTER — Other Ambulatory Visit: Payer: Self-pay

## 2020-12-27 DIAGNOSIS — Z9109 Other allergy status, other than to drugs and biological substances: Secondary | ICD-10-CM

## 2020-12-27 MED ORDER — MONTELUKAST SODIUM 10 MG PO TABS: 1.0000 | ORAL_TABLET | Freq: Every day | ORAL | 1 refills | Status: DC

## 2021-02-08 ENCOUNTER — Telehealth: Payer: Managed Care, Other (non HMO) | Admitting: Physician Assistant

## 2021-02-08 DIAGNOSIS — J019 Acute sinusitis, unspecified: Secondary | ICD-10-CM

## 2021-02-08 MED ORDER — AMOXICILLIN-POT CLAVULANATE 875-125 MG PO TABS: 1.0000 | ORAL_TABLET | Freq: Two times a day (BID) | ORAL | 0 refills | Status: DC

## 2021-02-08 NOTE — Progress Notes (Signed)

## 2021-03-21 ENCOUNTER — Other Ambulatory Visit: Payer: Self-pay

## 2021-03-21 DIAGNOSIS — Z9109 Other allergy status, other than to drugs and biological substances: Secondary | ICD-10-CM

## 2021-03-21 MED ORDER — FLUTICASONE PROPIONATE 50 MCG/ACT NA SUSP: 2.0000 | Freq: Every day | NASAL | 3 refills | Status: DC

## 2021-03-25 ENCOUNTER — Other Ambulatory Visit: Payer: Self-pay | Admitting: Family Medicine

## 2021-03-25 DIAGNOSIS — E785 Hyperlipidemia, unspecified: Secondary | ICD-10-CM

## 2021-03-25 NOTE — Telephone Encounter (Signed)
Requested medication (s) are due for refill today: yes  Requested medication (s) are on the active medication list: yes  Last refill:  09/25/20 #90 1 RF  Future visit scheduled: yes  Notes to clinic:  overdue lab work   Requested Prescriptions  Pending Prescriptions Disp Refills   atorvastatin (LIPITOR) 40 MG tablet [Pharmacy Med Name: ATORVASTATIN TABS '40MG'$ ] 90 tablet 3    Sig: TAKE 1 TABLET DAILY      Cardiovascular:  Antilipid - Statins Failed - 03/25/2021  9:41 AM      Failed - Total Cholesterol in normal range and within 360 days    Cholesterol, Total  Date Value Ref Range Status  02/25/2020 142 100 - 199 mg/dL Final          Failed - LDL in normal range and within 360 days    LDL Chol Calc (NIH)  Date Value Ref Range Status  02/25/2020 73 0 - 99 mg/dL Final          Failed - HDL in normal range and within 360 days    HDL  Date Value Ref Range Status  02/25/2020 47 >39 mg/dL Final          Failed - Triglycerides in normal range and within 360 days    Triglycerides  Date Value Ref Range Status  02/25/2020 121 0 - 149 mg/dL Final          Passed - Patient is not pregnant      Passed - Valid encounter within last 12 months    Recent Outpatient Visits           8 months ago Irritant contact dermatitis due to [place], Lupita Raider, FNP   11 months ago Annual physical exam   [facility], Lupita Raider, FNP   11 months ago Pneumonia due to infectious organism, unspecified laterality, unspecified part of lung   [facility], Lupita Raider, FNP   1 year ago Encounter for screening mammogram for malignant neoplasm of breast   [place], FNP   1 year ago Acute recurrent frontal sinusitis   [place], Devonne Doughty, DO       Future Appointments             In 1 month Baity, Coralie Keens, NP [facility], [facility]

## 2021-03-30 ENCOUNTER — Other Ambulatory Visit: Payer: Self-pay | Admitting: Family Medicine

## 2021-03-30 DIAGNOSIS — F418 Other specified anxiety disorders: Secondary | ICD-10-CM

## 2021-03-30 DIAGNOSIS — F410 Panic disorder [episodic paroxysmal anxiety] without agoraphobia: Secondary | ICD-10-CM

## 2021-03-30 NOTE — Telephone Encounter (Signed)
   Notes to clinic:  Patient scheduled for appt on 05/21/2021 Review for refill until that time    Requested Prescriptions  Pending Prescriptions Disp Refills   sertraline (ZOLOFT) 50 MG tablet [Pharmacy Med Name: SERTRALINE HCL TABS '50MG'$ ] 90 tablet 3    Sig: TAKE 1 TABLET DAILY     Psychiatry:  Antidepressants - SSRI Failed - 03/30/2021 11:40 AM      Failed - Valid encounter within last 6 months    Recent Outpatient Visits           8 months ago Irritant contact dermatitis due to [place], FNP   11 months ago Annual physical exam   [facility], Lupita Raider, FNP   11 months ago Pneumonia due to infectious organism, unspecified laterality, unspecified part of lung   [place], FNP   1 year ago Encounter for screening mammogram for malignant neoplasm of breast   [place], FNP   1 year ago Acute recurrent frontal sinusitis   [place], Devonne Doughty, DO       Future Appointments             In 1 month Baity, Coralie Keens, NP [facility], [facility]

## 2021-04-22 ENCOUNTER — Other Ambulatory Visit: Payer: Self-pay | Admitting: Internal Medicine

## 2021-04-22 DIAGNOSIS — E785 Hyperlipidemia, unspecified: Secondary | ICD-10-CM

## 2021-04-22 NOTE — Telephone Encounter (Signed)
Requested medication (s) are due for refill today: yes  Requested medication (s) are on the active medication list: yes  Last refill:  03/26/21 #30  Future visit scheduled: yes  Notes to clinic:  overdue lab work   Requested Prescriptions  Pending Prescriptions Disp Refills   atorvastatin (LIPITOR) 40 MG tablet [Pharmacy Med Name: ATORVASTATIN TABS '40MG'$ ] 30 tablet 11    Sig: TAKE 1 TABLET DAILY     Cardiovascular:  Antilipid - Statins Failed - 04/22/2021  9:51 AM      Failed - Total Cholesterol in normal range and within 360 days    Cholesterol, Total  Date Value Ref Range Status  02/25/2020 142 100 - 199 mg/dL Final          Failed - LDL in normal range and within 360 days    LDL Chol Calc (NIH)  Date Value Ref Range Status  02/25/2020 73 0 - 99 mg/dL Final          Failed - HDL in normal range and within 360 days    HDL  Date Value Ref Range Status  02/25/2020 47 >39 mg/dL Final          Failed - Triglycerides in normal range and within 360 days    Triglycerides  Date Value Ref Range Status  02/25/2020 121 0 - 149 mg/dL Final          Passed - Patient is not pregnant      Passed - Valid encounter within last 12 months    Recent Outpatient Visits           9 months ago Irritant contact dermatitis due to [place], Lupita Raider, FNP   1 year ago Annual physical exam   [facility], Lupita Raider, FNP   1 year ago Pneumonia due to infectious organism, unspecified laterality, unspecified part of lung   [facility], Lupita Raider, FNP   1 year ago Encounter for screening mammogram for malignant neoplasm of breast   [place], FNP   1 year ago Acute recurrent frontal sinusitis   [place], Devonne Doughty, DO       Future Appointments             In 4 weeks Garnette Gunner, Coralie Keens, NP [facility], [facility]

## 2021-05-06 ENCOUNTER — Other Ambulatory Visit: Payer: Self-pay | Admitting: Internal Medicine

## 2021-05-06 DIAGNOSIS — E785 Hyperlipidemia, unspecified: Secondary | ICD-10-CM

## 2021-05-07 NOTE — Telephone Encounter (Signed)
Pt has appt 05/21/21

## 2021-05-21 ENCOUNTER — Encounter: Payer: Managed Care, Other (non HMO) | Admitting: Internal Medicine

## 2021-05-21 NOTE — Progress Notes (Deleted)
Subjective:    Patient ID: Kelsey Key, female    DOB: [birth year], 60 y.o.   MRN: [id number]  HPI  Patient presents the clinic today for follow-up of chronic conditions.  She is establishing care with me today, transferring care from Lonie Peak, NP.  Osteopenia: She is taking calcium and vitamin D OTC.  She tries to get 30 minutes of weightbearing exercise daily.  Bone density from 04/2017 reviewed.  Thrombocytosis: Her last platelet count was 381, 11/2020.  She follows with hematology.  HLD: Her last LDL was 73, triglycerides 121, 02/2020.  She denies myalgias on Atorvastatin and Fish Oil.  She tries to consume a low-fat diet.  Anxiety with Panic Attacks: Chronic, managed with Sertraline.  She is not currently seeing a therapist.  She denies depression, SI/HI.  Review of Systems     Past Medical History:  Diagnosis Date   Anxiety    Hyperlipidemia    Osteopenia 03/27/2017    Current Outpatient Medications  Medication Sig Dispense Refill   albuterol (VENTOLIN HFA) 108 (90 Base) MCG/ACT inhaler Inhale 2 puffs into the lungs every 6 (six) hours as needed for wheezing or shortness of breath. 8 g 0   ALPRAZolam (XANAX) 0.25 MG tablet Take 1 tablet (0.25 mg total) by mouth 2 (two) times daily as needed for anxiety. 10 tablet 1   amoxicillin-clavulanate (AUGMENTIN) 875-125 MG tablet Take 1 tablet by mouth 2 (two) times daily. One po bid x 7 days 14 tablet 0   atorvastatin (LIPITOR) 40 MG tablet TAKE 1 TABLET DAILY 90 tablet 0   calcium-vitamin D (OSCAL WITH D) 250-125 MG-UNIT tablet Take 1 tablet by mouth daily.     cetirizine (ZYRTEC) 10 MG tablet Take 1 tablet (10 mg total) by mouth daily. 90 tablet 3   docusate sodium (COLACE) 100 MG capsule Take 1 capsule (100 mg total) by mouth daily. 90 capsule 1   fluticasone (FLONASE) 50 MCG/ACT nasal spray Place 2 sprays into both nostrils daily. 48 g 3   montelukast (SINGULAIR) 10 MG tablet Take 1 tablet (10 mg total) by mouth at bedtime. 90  tablet 1   Multiple Vitamin (MULTIVITAMIN) capsule Take by mouth.     Omega-3 Fatty Acids (FISH OIL) 1200 MG CAPS Take by mouth.     Probiotic CAPS      sertraline (ZOLOFT) 50 MG tablet TAKE 1 TABLET DAILY 90 tablet 0   TURMERIC PO Take by mouth.     vitamin E 400 UNIT capsule Take 400 Units by mouth daily.     No current facility-administered medications for this visit.    No Known Allergies  Family History  Problem Relation Age of Onset   Diabetes Mother    Osteoporosis Mother    Diabetes Father    Hypertension Brother    Throat cancer Maternal Grandmother    Healthy Brother    Heart attack Neg Hx    Stroke Neg Hx    Breast cancer Neg Hx    Colon cancer Neg Hx    Ovarian cancer Neg Hx     Social History   Socioeconomic History   Marital status: Married    Spouse name: Not on file   Number of children: Not on file   Years of education: Not on file   Highest education level: Not on file  Occupational History   Not on file  Tobacco Use   Smoking status: Every Day    Packs/day: 0.50  Years: 30.00    Pack years: 15.00    Types: Cigarettes   Smokeless tobacco: Never  Vaping Use   Vaping Use: Never used  Substance and Sexual Activity   Alcohol use: No   Drug use: No   Sexual activity: Yes    Birth control/protection: None    Comment: mutually monogamous long-term relationship  Other Topics Concern   Not on file  Social History Narrative   Not on file   Social Determinants of Health   Financial Resource Strain: Not on file  Food Insecurity: Not on file  Transportation Needs: Not on file  Physical Activity: Not on file  Stress: Not on file  Social Connections: Not on file  Intimate Partner Violence: Not on file     Constitutional: Denies fever, malaise, fatigue, headache or abrupt weight changes.  HEENT: Denies eye pain, eye redness, ear pain, ringing in the ears, wax buildup, runny nose, nasal congestion, bloody nose, or sore throat. Respiratory:  Denies difficulty breathing, shortness of breath, cough or sputum production.   Cardiovascular: Denies chest pain, chest tightness, palpitations or swelling in the hands or feet.  Gastrointestinal: Denies abdominal pain, bloating, constipation, diarrhea or blood in the stool.  GU: Denies urgency, frequency, pain with urination, burning sensation, blood in urine, odor or discharge. Musculoskeletal: Denies decrease in range of motion, difficulty with gait, muscle pain or joint pain and swelling.  Skin: Denies redness, rashes, lesions or ulcercations.  Neurological: Denies dizziness, difficulty with memory, difficulty with speech or problems with balance and coordination.  Psych: Patient has history of anxiety and panic attacks.  Denies depression, SI/HI.  No other specific complaints in a complete review of systems (except as listed in HPI above).  Objective:   Physical Exam   There were no vitals taken for this visit. Wt Readings from Last 3 Encounters:  07/26/20 191 lb (86.6 kg)  04/18/20 195 lb (88.5 kg)  12/28/19 194 lb 12.8 oz (88.4 kg)    General: Appears their stated age, well developed, well nourished in NAD. Skin: Warm, dry and intact. No rashes, lesions or ulcerations noted. HEENT: Head: normal shape and size; Eyes: sclera white, no icterus, conjunctiva pink, PERRLA and EOMs intact; Ears: Tm's gray and intact, normal light reflex; Nose: mucosa pink and moist, septum midline; Throat/Mouth: Teeth present, mucosa pink and moist, no exudate, lesions or ulcerations noted.  Neck:  Neck supple, trachea midline. No masses, lumps or thyromegaly present.  Cardiovascular: Normal rate and rhythm. S1,S2 noted.  No murmur, rubs or gallops noted. No JVD or BLE edema. No carotid bruits noted. Pulmonary/Chest: Normal effort and positive vesicular breath sounds. No respiratory distress. No wheezes, rales or ronchi noted.  Abdomen: Soft and nontender. Normal bowel sounds. No distention or masses  noted. Liver, spleen and kidneys non palpable. Musculoskeletal: Normal range of motion. No signs of joint swelling. No difficulty with gait.  Neurological: Alert and oriented. Cranial nerves II-XII grossly intact. Coordination normal.  Psychiatric: Mood and affect normal. Behavior is normal. Judgment and thought content normal.    BMET    Component Value Date/Time   NA 140 02/25/2020 0815   K 4.9 02/25/2020 0815   CL 105 02/25/2020 0815   CO2 24 02/25/2020 0815   GLUCOSE 99 02/25/2020 0815   BUN 10 02/25/2020 0815   CREATININE 0.83 02/25/2020 0815   CALCIUM 9.5 02/25/2020 0815   GFRNONAA 77 02/25/2020 0815   GFRAA 89 02/25/2020 0815    Lipid Panel  Component Value Date/Time   CHOL 142 02/25/2020 0815   TRIG 121 02/25/2020 0815   HDL 47 02/25/2020 0815   CHOLHDL 3.0 02/25/2020 0815   LDLCALC 73 02/25/2020 0815    CBC    Component Value Date/Time   WBC 10.3 02/25/2020 0815   RBC 4.46 02/25/2020 0815   HGB 13.8 02/25/2020 0815   HCT 42.0 02/25/2020 0815   PLT 457 (H) 02/25/2020 0815   MCV 94 02/25/2020 0815   MCH 30.9 02/25/2020 0815   MCHC 32.9 02/25/2020 0815   RDW 13.1 02/25/2020 0815   LYMPHSABS 3.7 (H) 02/25/2020 0815   EOSABS 0.1 02/25/2020 0815   BASOSABS 0.1 02/25/2020 0815    Hgb A1C No results found for: HGBA1C         Assessment & Plan:     Webb Silversmith, NP This visit occurred during the SARS-CoV-2 public health emergency.  Safety protocols were in place, including screening questions prior to the visit, additional usage of staff PPE, and extensive cleaning of exam room while observing appropriate contact time as indicated for disinfecting solutions.

## 2021-05-24 ENCOUNTER — Other Ambulatory Visit: Payer: Self-pay

## 2021-05-24 ENCOUNTER — Ambulatory Visit (INDEPENDENT_AMBULATORY_CARE_PROVIDER_SITE_OTHER): Payer: Managed Care, Other (non HMO) | Admitting: Internal Medicine

## 2021-05-24 ENCOUNTER — Encounter: Payer: Self-pay | Admitting: Internal Medicine

## 2021-05-24 VITALS — BP 112/57 | HR 73 | Temp 97.7°F | Resp 17 | Ht 68.0 in | Wt 185.8 lb

## 2021-05-24 DIAGNOSIS — E663 Overweight: Secondary | ICD-10-CM

## 2021-05-24 DIAGNOSIS — D75839 Thrombocytosis, unspecified: Secondary | ICD-10-CM

## 2021-05-24 DIAGNOSIS — F418 Other specified anxiety disorders: Secondary | ICD-10-CM | POA: Diagnosis not present

## 2021-05-24 DIAGNOSIS — I700 Atherosclerosis of aorta: Secondary | ICD-10-CM

## 2021-05-24 DIAGNOSIS — E782 Mixed hyperlipidemia: Secondary | ICD-10-CM | POA: Diagnosis not present

## 2021-05-24 DIAGNOSIS — Z6829 Body mass index (BMI) 29.0-29.9, adult: Secondary | ICD-10-CM | POA: Insufficient documentation

## 2021-05-24 DIAGNOSIS — Z9109 Other allergy status, other than to drugs and biological substances: Secondary | ICD-10-CM

## 2021-05-24 DIAGNOSIS — M8589 Other specified disorders of bone density and structure, multiple sites: Secondary | ICD-10-CM

## 2021-05-24 DIAGNOSIS — E6609 Other obesity due to excess calories: Secondary | ICD-10-CM | POA: Insufficient documentation

## 2021-05-24 DIAGNOSIS — Z6828 Body mass index (BMI) 28.0-28.9, adult: Secondary | ICD-10-CM

## 2021-05-24 DIAGNOSIS — F410 Panic disorder [episodic paroxysmal anxiety] without agoraphobia: Secondary | ICD-10-CM

## 2021-05-24 DIAGNOSIS — Z853 Personal history of malignant neoplasm of breast: Secondary | ICD-10-CM

## 2021-05-24 MED ORDER — FLUTICASONE PROPIONATE 50 MCG/ACT NA SUSP: 2.0000 | Freq: Every day | NASAL | 3 refills | Status: DC

## 2021-05-24 MED ORDER — ALPRAZOLAM 0.25 MG PO TABS: 0.2500 mg | ORAL_TABLET | Freq: Every day | ORAL | 0 refills | Status: DC | PRN

## 2021-05-24 MED ORDER — MONTELUKAST SODIUM 10 MG PO TABS: 10.0000 mg | ORAL_TABLET | Freq: Every day | ORAL | 3 refills | Status: DC

## 2021-05-24 MED ORDER — ATORVASTATIN CALCIUM 40 MG PO TABS: 40.0000 mg | ORAL_TABLET | Freq: Every day | ORAL | 3 refills | Status: DC

## 2021-05-24 MED ORDER — SERTRALINE HCL 50 MG PO TABS: 50.0000 mg | ORAL_TABLET | Freq: Every day | ORAL | 3 refills | Status: DC

## 2021-05-24 NOTE — Patient Instructions (Signed)
Heart-Healthy Eating Plan Heart-healthy meal planning includes: Eating less unhealthy fats. Eating more healthy fats. Making other changes in your diet. Talk with your doctor or a diet specialist (dietitian) to create an eating plan that is right for you. What is my plan? Your doctor may recommend an eating plan that includes: Total fat: ______% or less of total calories a day. Saturated fat: ______% or less of total calories a day. Cholesterol: less than _________mg a day. What are tips for following this plan? Cooking Avoid frying your food. Try to bake, boil, grill, or broil it instead. You can also reduce fat by: Removing the skin from poultry. Removing all visible fats from meats. Steaming vegetables in water or broth. Meal planning  At meals, divide your plate into four equal parts: Fill one-half of your plate with vegetables and green salads. Fill one-fourth of your plate with whole grains. Fill one-fourth of your plate with lean protein foods. Eat 4-5 servings of vegetables per day. A serving of vegetables is: 1 cup of raw or cooked vegetables. 2 cups of raw leafy greens. Eat 4-5 servings of fruit per day. A serving of fruit is: 1 medium whole fruit.  cup of dried fruit.  cup of fresh, frozen, or canned fruit.  cup of 100% fruit juice. Eat more foods that have soluble fiber. These are apples, broccoli, carrots, beans, peas, and barley. Try to get 20-30 g of fiber per day. Eat 4-5 servings of nuts, legumes, and seeds per week: 1 serving of dried beans or legumes equals  cup after being cooked. 1 serving of nuts is  cup. 1 serving of seeds equals 1 tablespoon. General information Eat more home-cooked food. Eat less restaurant, buffet, and fast food. Limit or avoid alcohol. Limit foods that are high in starch and sugar. Avoid fried foods. Lose weight if you are overweight. Keep track of how much salt (sodium) you eat. This is important if you have high blood  pressure. Ask your doctor to tell you more about this. Try to add vegetarian meals each week. Fats Choose healthy fats. These include olive oil and canola oil, flaxseeds, walnuts, almonds, and seeds. Eat more omega-3 fats. These include salmon, mackerel, sardines, tuna, flaxseed oil, and ground flaxseeds. Try to eat fish at least 2 times each week. Check food labels. Avoid foods with trans fats or high amounts of saturated fat. Limit saturated fats. These are often found in animal products, such as meats, butter, and cream. These are also found in plant foods, such as palm oil, palm kernel oil, and coconut oil. Avoid foods with partially hydrogenated oils in them. These have trans fats. Examples are stick margarine, some tub margarines, cookies, crackers, and other baked goods. What foods can I eat? Fruits All fresh, canned (in natural juice), or frozen fruits. Vegetables Fresh or frozen vegetables (raw, steamed, roasted, or grilled). Green salads. Grains Most grains. Choose whole wheat and whole grains most of the time. Rice and pasta, including brown rice and pastas made with whole wheat. Meats and other proteins Lean, well-trimmed beef, veal, pork, and lamb. Chicken and turkey without skin. All fish and shellfish. Wild duck, rabbit, pheasant, and venison. Egg whites or low-cholesterol egg substitutes. Dried beans, peas, lentils, and tofu. Seeds and most nuts. Dairy Low-fat or nonfat cheeses, including ricotta and mozzarella. Skim or 1% milk that is liquid, powdered, or evaporated. Buttermilk that is made with low-fat milk. Nonfat or low-fat yogurt. Fats and oils Non-hydrogenated (trans-free) margarines. Vegetable oils, including   soybean, sesame, sunflower, olive, peanut, safflower, corn, canola, and cottonseed. Salad dressings or mayonnaise made with a vegetable oil. Beverages Mineral water. Coffee and tea. Diet carbonated beverages. Sweets and desserts Sherbet, gelatin, and fruit ice.  Small amounts of dark chocolate. Limit all sweets and desserts. Seasonings and condiments All seasonings and condiments. The items listed above may not be a complete list of foods and drinks you can eat. Contact a dietitian for more options. What foods should I avoid? Fruits Canned fruit in heavy syrup. Fruit in cream or butter sauce. Fried fruit. Limit coconut. Vegetables Vegetables cooked in cheese, cream, or butter sauce. Fried vegetables. Grains Breads that are made with saturated or trans fats, oils, or whole milk. Croissants. Sweet rolls. Donuts. High-fat crackers, such as cheese crackers. Meats and other proteins Fatty meats, such as hot dogs, ribs, sausage, bacon, rib-eye roast or steak. High-fat deli meats, such as salami and bologna. Caviar. Domestic duck and goose. Organ meats, such as liver. Dairy Cream, sour cream, cream cheese, and creamed cottage cheese. Whole-milk cheeses. Whole or 2% milk that is liquid, evaporated, or condensed. Whole buttermilk. Cream sauce or high-fat cheese sauce. Yogurt that is made from whole milk. Fats and oils Meat fat, or shortening. Cocoa butter, hydrogenated oils, palm oil, coconut oil, palm kernel oil. Solid fats and shortenings, including bacon fat, salt pork, lard, and butter. Nondairy cream substitutes. Salad dressings with cheese or sour cream. Beverages Regular sodas and juice drinks with added sugar. Sweets and desserts Frosting. Pudding. Cookies. Cakes. Pies. Milk chocolate or white chocolate. Buttered syrups. Full-fat ice cream or ice cream drinks. The items listed above may not be a complete list of foods and drinks to avoid. Contact a dietitian for more information. Summary Heart-healthy meal planning includes eating less unhealthy fats, eating more healthy fats, and making other changes in your diet. Eat a balanced diet. This includes fruits and vegetables, low-fat or nonfat dairy, lean protein, nuts and legumes, whole grains, and  heart-healthy oils and fats. This information is not intended to replace advice given to you by your health care provider. Make sure you discuss any questions you have with your health care provider. Document Revised: 12/14/2020 Document Reviewed: 12/14/2020 Elsevier Patient Education  2022 [employer] Inc.  

## 2021-05-24 NOTE — Assessment & Plan Note (Signed)
She will bring me a copy of her lipid panel [place] she [place] the [place] Encouraged her to consume a low-fat diet Continue Atorvastatin and Fish Oil, refilled today

## 2021-05-24 NOTE — Assessment & Plan Note (Addendum)
Encouraged diet and exercise for weight loss ?

## 2021-05-24 NOTE — Assessment & Plan Note (Signed)
Last platelet count WNL Follows with [facility] hematology Will monitor

## 2021-05-24 NOTE — Assessment & Plan Note (Signed)
Continue calcium and vitamin D OTC Encouraged daily weightbearing exercise We will repeat bone density in 2023

## 2021-05-24 NOTE — Assessment & Plan Note (Signed)
Currently having nipple discharge She has left a message with her oncologist Will follow yearly mammogram

## 2021-05-24 NOTE — Assessment & Plan Note (Signed)
Stable on Sertraline and Xanax, refilled today Support offered

## 2021-05-24 NOTE — Progress Notes (Signed)
Subjective:    Patient ID: Kelsey Key, female    DOB: Oct 01, 1960, 59 y.o.   MRN: [id number]  HPI  Patient presents the clinic today for follow-up of chronic conditions.  She is establishing care with me today, transferring care from Lonie Peak, NP.  HLD with Aortic Atherosclerosis: Her last LDL was 73, triglycerides 121, 02/2020.  She denies myalgias on Atorvastatin and Fish Oil.  She does not consume a low-fat diet.  Osteopenia: She is taking Calcium and Vitamin D OTC.  She is trying to get 30 minutes of weightbearing exercise daily.  Bone density from 04/2017 reviewed.  Thrombocytosis: Her last platelet count was 381, 11/2020.  She follows with [facility] hematology.  Anxiety with Panic Disorder: Chronic, managed on Sertraline and Xanax.  She is not currently seeing a therapist.  She denies depression, SI/HI.  Hx of Breast Cancer: In remission. S/p lumpectomy, s/p chemo and radiation. She follows with [facility] oncology.  Review of Systems  Past Medical History:  Diagnosis Date   Anxiety    Hyperlipidemia    Osteopenia 03/27/2017    Current Outpatient Medications  Medication Sig Dispense Refill   albuterol (VENTOLIN HFA) 108 (90 Base) MCG/ACT inhaler Inhale 2 puffs into the lungs every 6 (six) hours as needed for wheezing or shortness of breath. 8 g 0   ALPRAZolam (XANAX) 0.25 MG tablet Take 1 tablet (0.25 mg total) by mouth 2 (two) times daily as needed for anxiety. 10 tablet 1   amoxicillin-clavulanate (AUGMENTIN) 875-125 MG tablet Take 1 tablet by mouth 2 (two) times daily. One po bid x 7 days 14 tablet 0   atorvastatin (LIPITOR) 40 MG tablet TAKE 1 TABLET DAILY 90 tablet 0   calcium-vitamin D (OSCAL WITH D) 250-125 MG-UNIT tablet Take 1 tablet by mouth daily.     cetirizine (ZYRTEC) 10 MG tablet Take 1 tablet (10 mg total) by mouth daily. 90 tablet 3   docusate sodium (COLACE) 100 MG capsule Take 1 capsule (100 mg total) by mouth daily. 90 capsule 1   fluticasone (FLONASE) 50 MCG/ACT  nasal spray Place 2 sprays into both nostrils daily. 48 g 3   montelukast (SINGULAIR) 10 MG tablet Take 1 tablet (10 mg total) by mouth at bedtime. 90 tablet 1   Multiple Vitamin (MULTIVITAMIN) capsule Take by mouth.     Omega-3 Fatty Acids (FISH OIL) 1200 MG CAPS Take by mouth.     Probiotic CAPS      sertraline (ZOLOFT) 50 MG tablet TAKE 1 TABLET DAILY 90 tablet 0   TURMERIC PO Take by mouth.     vitamin E 400 UNIT capsule Take 400 Units by mouth daily.     No current facility-administered medications for this visit.    No Known Allergies  Family History  Problem Relation Age of Onset   Diabetes Mother    Osteoporosis Mother    Diabetes Father    Hypertension Brother    Throat cancer Maternal Grandmother    Healthy Brother    Heart attack Neg Hx    Stroke Neg Hx    Breast cancer Neg Hx    Colon cancer Neg Hx    Ovarian cancer Neg Hx     Social History   Socioeconomic History   Marital status: Married    Spouse name: Not on file   Number of children: Not on file   Years of education: Not on file   Highest education level: Not on file  Occupational History  Not on file  Tobacco Use   Smoking status: Every Day    Packs/day: 0.50    Years: 30.00    Pack years: 15.00    Types: Cigarettes   Smokeless tobacco: Never  Vaping Use   Vaping Use: Never used  Substance and Sexual Activity   Alcohol use: No   Drug use: No   Sexual activity: Yes    Birth control/protection: None    Comment: mutually monogamous long-term relationship  Other Topics Concern   Not on file  Social History Narrative   Not on file   Social Determinants of Health   Financial Resource Strain: Not on file  Food Insecurity: Not on file  Transportation Needs: Not on file  Physical Activity: Not on file  Stress: Not on file  Social Connections: Not on file  Intimate Partner Violence: Not on file     Constitutional: Denies fever, malaise, fatigue, headache or abrupt weight changes.   HEENT: Denies eye pain, eye redness, ear pain, ringing in the ears, wax buildup, runny nose, nasal congestion, bloody nose, or sore throat. Respiratory: Denies difficulty breathing, shortness of breath, cough or sputum production.   Cardiovascular: Denies chest pain, chest tightness, palpitations or swelling in the hands or feet.  Gastrointestinal: Denies abdominal pain, bloating, constipation, diarrhea or blood in the stool.  GU: Denies urgency, frequency, pain with urination, burning sensation, blood in urine, odor or discharge. Musculoskeletal: Denies decrease in range of motion, difficulty with gait, muscle pain or joint pain and swelling.  Skin: Pt reports nipple discharge from right breast. Denies redness, rashes, lesions or ulcercations.  Neurological: Denies dizziness, difficulty with memory, difficulty with speech or problems with balance and coordination.  Psych: Patient has a history of anxiety and panic disorder.  Denies depression, SI/HI.  No other specific complaints in a complete review of systems (except as listed in HPI above).     Objective:   Physical Exam  BP (!) 112/57 (BP Location: Right Arm, Patient Position: Sitting, Cuff Size: Normal)   Pulse 73   Temp 97.7 F (36.5 C) (Temporal)   Resp 17   Ht 5\' 8"  (1.727 m)   Wt 185 lb 12.8 oz (84.3 kg)   SpO2 100%   BMI 28.25 kg/m   Wt Readings from Last 3 Encounters:  07/26/20 191 lb (86.6 kg)  04/18/20 195 lb (88.5 kg)  12/28/19 194 lb 12.8 oz (88.4 kg)    General: Appears her stated age, overweight in NAD. Skin: Warm, dry and intact. No rashes, lesions or ulcerations noted. HEENT: Head: normal shape and size; Eyes: sclera white and EOMs intact;  Cardiovascular: Normal rate and rhythm. S1,S2 noted.  No murmur, rubs or gallops noted. No JVD or BLE edema. No carotid bruits noted. Pulmonary/Chest: Normal effort and positive vesicular breath sounds. No respiratory distress. No wheezes, rales or ronchi noted.   Neurological: Alert and oriented.  Psychiatric: Mood and affect normal. Behavior is normal. Judgment and thought content normal.    BMET    Component Value Date/Time   NA 140 02/25/2020 0815   K 4.9 02/25/2020 0815   CL 105 02/25/2020 0815   CO2 24 02/25/2020 0815   GLUCOSE 99 02/25/2020 0815   BUN 10 02/25/2020 0815   CREATININE 0.83 02/25/2020 0815   CALCIUM 9.5 02/25/2020 0815   GFRNONAA 77 02/25/2020 0815   GFRAA 89 02/25/2020 0815    Lipid Panel     Component Value Date/Time   CHOL 142 02/25/2020 0815  TRIG 121 02/25/2020 0815   HDL 47 02/25/2020 0815   CHOLHDL 3.0 02/25/2020 0815   LDLCALC 73 02/25/2020 0815    CBC    Component Value Date/Time   WBC 10.3 02/25/2020 0815   RBC 4.46 02/25/2020 0815   HGB 13.8 02/25/2020 0815   HCT 42.0 02/25/2020 0815   PLT 457 (H) 02/25/2020 0815   MCV 94 02/25/2020 0815   MCH 30.9 02/25/2020 0815   MCHC 32.9 02/25/2020 0815   RDW 13.1 02/25/2020 0815   LYMPHSABS 3.7 (H) 02/25/2020 0815   EOSABS 0.1 02/25/2020 0815   BASOSABS 0.1 02/25/2020 0815    Hgb A1C No results found for: HGBA1C         Assessment & Plan:    Webb Silversmith, NP This visit occurred during the SARS-CoV-2 public health emergency.  Safety protocols were in place, including screening questions prior to the visit, additional usage of staff PPE, and extensive cleaning of exam room while observing appropriate contact time as indicated for disinfecting solutions.

## 2021-06-26 ENCOUNTER — Telehealth: Payer: Managed Care, Other (non HMO) | Admitting: Family Medicine

## 2021-06-26 DIAGNOSIS — J0140 Acute pansinusitis, unspecified: Secondary | ICD-10-CM

## 2021-06-26 MED ORDER — AMOXICILLIN-POT CLAVULANATE 875-125 MG PO TABS: 1.0000 | ORAL_TABLET | Freq: Two times a day (BID) | ORAL | 0 refills | Status: AC

## 2021-06-26 NOTE — Progress Notes (Signed)

## 2021-07-16 ENCOUNTER — Telehealth: Payer: Managed Care, Other (non HMO) | Admitting: Emergency Medicine

## 2021-07-16 DIAGNOSIS — J069 Acute upper respiratory infection, unspecified: Secondary | ICD-10-CM

## 2021-07-16 MED ORDER — FLUTICASONE PROPIONATE 50 MCG/ACT NA SUSP: 2.0000 | Freq: Every day | NASAL | 0 refills | Status: DC

## 2021-07-16 MED ORDER — BENZONATATE 100 MG PO CAPS: 100.0000 mg | ORAL_CAPSULE | Freq: Two times a day (BID) | ORAL | 0 refills | Status: DC | PRN

## 2021-07-16 NOTE — Progress Notes (Signed)
E-Visit for Upper Respiratory Infection   We are sorry you are not feeling well.  Here is how we plan to help!  Based on what you have shared with me, it looks like you may have a viral upper respiratory infection.  Upper respiratory infections are caused by a large number of viruses; however, rhinovirus is the most common cause.   I wouldn't add additional antibiotics based on the description of your symptoms.   If you feel like you are significantly worsening, I'd make an appointment with your doctor, but this is likely viral and will need to run its course.    Symptoms vary from person to person, with common symptoms including sore throat, cough, fatigue or lack of energy and feeling of general discomfort.  A low-grade fever of up to 100.4 may present, but is often uncommon.  Symptoms vary however, and are closely related to a person's age or underlying illnesses.  The most common symptoms associated with an upper respiratory infection are nasal discharge or congestion, cough, sneezing, headache and pressure in the ears and face.  These symptoms usually persist for about 3 to 10 days, but can last up to 2 weeks.  It is important to know that upper respiratory infections do not cause serious illness or complications in most cases.    Upper respiratory infections can be transmitted from person to person, with the most common method of transmission being a person's hands.  The virus is able to live on the skin and can infect other persons for up to 2 hours after direct contact.  Also, these can be transmitted when someone coughs or sneezes; thus, it is important to cover the mouth to reduce this risk.  To keep the spread of the illness at [place], good hand hygiene is very important.  This is an infection that is most likely caused by a virus. There are no specific treatments other than to help you with the symptoms until the infection runs its course.  We are sorry you are not feeling well.  Here is how  we plan to help!   For nasal congestion, you may use an oral decongestants such as Mucinex D or if you have glaucoma or high blood pressure use plain Mucinex.  Saline nasal spray or nasal drops can help and can safely be used as often as needed for congestion.  For your congestion, I have prescribed Fluticasone nasal spray one spray in each nostril twice a day  If you do not have a history of heart disease, hypertension, diabetes or thyroid disease, prostate/bladder issues or glaucoma, you may also use Sudafed to treat nasal congestion.  It is highly recommended that you consult with a pharmacist or your primary care physician to ensure this medication is safe for you to take.     If you have a cough, you may use cough suppressants such as Delsym and Robitussin.  If you have glaucoma or high blood pressure, you can also use Coricidin HBP.   For cough I have prescribed for you A prescription cough medication called Tessalon Perles 100 mg. You may take 1-2 capsules every 8 hours as needed for cough  If you have a sore or scratchy throat, use a saltwater gargle-  to  teaspoon of salt dissolved in a 4-ounce to 8-ounce glass of warm water.  Gargle the solution for approximately 15-30 seconds and then spit.  It is important not to swallow the solution.  You can also use throat lozenges/cough  drops and Chloraseptic spray to help with throat pain or discomfort.  Warm or cold liquids can also be helpful in relieving throat pain.  For headache, pain or general discomfort, you can use Ibuprofen or Tylenol as directed.   Some authorities believe that zinc sprays or the use of Echinacea may shorten the course of your symptoms.   HOME CARE Only take medications as instructed by your medical team. Be sure to drink plenty of fluids. Water is fine as well as fruit juices, sodas and electrolyte beverages. You may want to stay away from caffeine or alcohol. If you are nauseated, try taking small sips of liquids.  How do you know if you are getting enough fluid? Your urine should be a pale yellow or almost colorless. Get rest. Taking a steamy shower or using a humidifier may help nasal congestion and ease sore throat pain. You can place a towel over your head and breathe in the steam from hot water coming from a faucet. Using a saline nasal spray works much the same way. Cough drops, hard candies and sore throat lozenges may ease your cough. Avoid close contacts especially the very young and the elderly Cover your mouth if you cough or sneeze Always remember to wash your hands.   GET HELP RIGHT AWAY IF: You develop worsening fever. If your symptoms do not improve within 10 days You develop yellow or green discharge from your nose over 3 days. You have coughing fits You develop a severe head ache or visual changes. You develop shortness of breath, difficulty breathing or start having chest pain Your symptoms persist after you have completed your treatment plan  MAKE SURE YOU  Understand these instructions. Will watch your condition. Will get help right away if you are not doing well or get worse.  Thank you for choosing an e-visit.  Your e-visit answers were reviewed by a board certified advanced clinical practitioner to complete your personal care plan. Depending upon the condition, your plan could have included both over the counter or prescription medications.  Please review your pharmacy choice. Make sure the pharmacy is open so you can pick up prescription now. If there is a problem, you may contact your provider through [employer] and have the prescription routed to another pharmacy.  Your safety is important to [place]. If you have drug allergies check your prescription carefully.   For the next 24 hours you can use MyChart to ask questions about today's visit, request a non-urgent call back, or ask for a work or school excuse. You will get an email in the next two days asking about your  experience. I hope that your e-visit has been valuable and will speed your recovery.    Approximately 5 minutes was used in reviewing the patient's chart, questionnaire, prescribing medications, and documentation.

## 2021-07-23 ENCOUNTER — Telehealth: Payer: Managed Care, Other (non HMO) | Admitting: Family Medicine

## 2021-07-23 DIAGNOSIS — J019 Acute sinusitis, unspecified: Secondary | ICD-10-CM | POA: Diagnosis not present

## 2021-07-23 DIAGNOSIS — F17200 Nicotine dependence, unspecified, uncomplicated: Secondary | ICD-10-CM

## 2021-07-23 MED ORDER — DOXYCYCLINE HYCLATE 100 MG PO TABS
100.0000 mg | ORAL_TABLET | Freq: Two times a day (BID) | ORAL | 0 refills | Status: AC
Start: 2021-07-23 — End: 2021-08-02

## 2021-07-23 MED ORDER — PREDNISONE 10 MG (21) PO TBPK: ORAL_TABLET | ORAL | 0 refills | Status: DC

## 2021-07-23 NOTE — Progress Notes (Signed)
Virtual Visit Consent   Kelsey Key, you are scheduled for a virtual visit with a [facility] provider today.     Just as with appointments in the office, your consent must be obtained to participate.  Your consent will be active for this visit and any virtual visit you may have with one of our providers in the next 365 days.     If you have a MyChart account, a copy of this consent can be sent to you electronically.  All virtual visits are billed to your insurance company just like a traditional visit in the office.    As this is a virtual visit, video technology does not allow for your provider to perform a traditional examination.  This may limit your provider's ability to fully assess your condition.  If your provider identifies any concerns that need to be evaluated in person or the need to arrange testing (such as labs, EKG, etc.), we will make arrangements to do so.     Although advances in technology are sophisticated, we cannot ensure that it will always work on either your end or our end.  If the connection with a video visit is poor, the visit may have to be switched to a telephone visit.  With either a video or telephone visit, we are not always able to ensure that we have a secure connection.     I need to obtain your verbal consent now.   Are you willing to proceed with your visit today?    Kelsey Key has provided verbal consent on 07/23/2021 for a virtual visit (video or telephone).   Perlie Mayo, NP   Date: 07/23/2021 8:42 AM   Virtual Visit via Video Note   I, Perlie Mayo, connected with  Kelsey Key  ([id number], 02-Mar-1961) on 07/23/21 at  8:50 AM EST by a video-enabled telemedicine application and verified that I am speaking with the correct person using two identifiers.  Location: Patient: Virtual Visit Location Patient: Home Provider: Virtual Visit Location Provider: Home Office   I discussed the limitations of evaluation and management by telemedicine  and the availability of in person appointments. The patient expressed understanding and agreed to proceed.    History of Present Illness: Kelsey Key is a 60 y.o. who identifies as a female who was assigned female at birth, and is being seen today for what appears to be an on going/ recurrent sinus infection.  Was treated by me on 06/26/21 via evisit for a sinus infection- Augmentin was given but reports that she was not able to tolerate it well and reduced the pills by cutting them in half. Reports never really feeling much better and ended up doing another evisit on 07/16/21- was told viral infection and wait 5 more days and so today she is on for VV as she is miserable with sinus pressure, cheek pain, teeth pain, and now left ear pain.  Is a smoker and smokes 5 cigarettes daily at work, she is trying to quit.   She reports increased shortness of breath with the above, mild coughing as well- close to baseline for her. She denies chest pain, known sick contacts.   Problems:  Patient Active Problem List   Diagnosis Date Noted   Aortic atherosclerosis ([place]) 05/24/2021   Overweight with body mass index (BMI) of 28 to 28.9 in adult 05/24/2021   History of right breast cancer 05/24/2021   Situational anxiety 03/23/2018   Osteopenia 03/27/2017  Hyperlipidemia 03/27/2017   Panic disorder 03/27/2017   Thrombocytosis 03/16/2014    Allergies:  Allergies  Allergen Reactions   Chlorhexidine Rash    Patient developed rash after surgery. She thinks it is related to surgical prep.    Medications:  Current Outpatient Medications:    albuterol (VENTOLIN HFA) 108 (90 Base) MCG/ACT inhaler, Inhale 2 puffs into the lungs every 6 (six) hours as needed for wheezing or shortness of breath., Disp: 8 g, Rfl: 0   ALPRAZolam (XANAX) 0.25 MG tablet, Take 1 tablet (0.25 mg total) by mouth daily as needed for anxiety., Disp: 10 tablet, Rfl: 0   atorvastatin (LIPITOR) 40 MG tablet, Take 1 tablet (40 mg total) by  mouth daily., Disp: 90 tablet, Rfl: 3   benzonatate (TESSALON) 100 MG capsule, Take 1 capsule (100 mg total) by mouth 2 (two) times daily as needed for cough., Disp: 20 capsule, Rfl: 0   calcium-vitamin D (OSCAL WITH D) 250-125 MG-UNIT tablet, Take 1 tablet by mouth daily., Disp: , Rfl:    cetirizine (ZYRTEC) 10 MG tablet, Take 1 tablet (10 mg total) by mouth daily., Disp: 90 tablet, Rfl: 3   docusate sodium (COLACE) 100 MG capsule, Take 1 capsule (100 mg total) by mouth daily., Disp: 90 capsule, Rfl: 1   fluticasone (FLONASE) 50 MCG/ACT nasal spray, Place 2 sprays into both nostrils daily., Disp: 9.9 mL, Rfl: 0   montelukast (SINGULAIR) 10 MG tablet, Take 1 tablet (10 mg total) by mouth at bedtime., Disp: 90 tablet, Rfl: 3   Multiple Vitamin (MULTIVITAMIN) capsule, Take by mouth., Disp: , Rfl:    Omega-3 Fatty Acids (FISH OIL) 1200 MG CAPS, Take by mouth., Disp: , Rfl:    Probiotic CAPS, , Disp: , Rfl:    sertraline (ZOLOFT) 50 MG tablet, Take 1 tablet (50 mg total) by mouth daily., Disp: 90 tablet, Rfl: 3   TURMERIC PO, Take by mouth., Disp: , Rfl:    vitamin B-12 (CYANOCOBALAMIN) 100 MCG tablet, Take 100 mcg by mouth daily., Disp: , Rfl:    vitamin E 400 UNIT capsule, Take 400 Units by mouth daily., Disp: , Rfl:   Observations/Objective: Patient is well-developed, well-nourished in no acute distress.  Resting comfortably  at home.  Head is normocephalic, atraumatic.  No labored breathing.  Speech is clear and coherent with logical content.  Patient is alert and oriented at baseline.  Tenderness across left cheek when asked to palpate  Assessment and Plan: 1. Acute sinusitis, recurrence not specified, unspecified location Recurrent infection- most likely related to alter anbx dosage due to intolerance. Educated on this and how to take medications with food, or call Dr if not able to tolerate to make a plan- verbalized understanding.  Due to recurrence and smoking HX will try dox and  pred pack.  - doxycycline (VIBRA-TABS) 100 MG tablet; Take 1 tablet (100 mg total) by mouth 2 (two) times daily for 10 days.  Dispense: 20 tablet; Refill: 0 - predniSONE (STERAPRED UNI-PAK 21 TAB) 10 MG (21) TBPK tablet; Take as directed on the package  Dispense: 21 tablet; Refill: 0   2. Smoker Education and encouragement provided   Reviewed side effects, risks and benefits of medication.    Patient acknowledged agreement and understanding of the plan.   I discussed the assessment and treatment plan with the patient. The patient was provided an opportunity to ask questions and all were answered. The patient agreed with the plan and demonstrated an understanding of the instructions.  The patient was advised to call back or seek an in-person evaluation if the symptoms worsen or if the condition fails to improve as anticipated.   The above assessment and management plan was discussed with the patient. The patient verbalized understanding of and has agreed to the management plan. Patient is aware to call the clinic if symptoms persist or worsen. Patient is aware when to return to the clinic for a follow-up visit. Patient educated on when it is appropriate to go to the emergency department.     Follow Up Instructions: I discussed the assessment and treatment plan with the patient. The patient was provided an opportunity to ask questions and all were answered. The patient agreed with the plan and demonstrated an understanding of the instructions.  A copy of instructions were sent to the patient via MyChart unless otherwise noted below.     The patient was advised to call back or seek an in-person evaluation if the symptoms worsen or if the condition fails to improve as anticipated.  Time:  I spent 15 minutes with the patient via telehealth technology discussing the above problems/concerns.    Perlie Mayo, NP

## 2021-07-23 NOTE — Patient Instructions (Signed)
I appreciate the opportunity to provide you with care for your health and wellness.  Take medication with food to help with tolerance. Message back or call PCP if not tolerating it.  Complete the pred dose pack to help with breathing.  GREAT JOB ON REDUCING SMOKING- KEEP WORKING ON IT!  Happy Holidays :)   Please continue to practice social distancing to keep you, your family, and our community safe.  If you must go out, please wear a mask and practice good handwashing.  Have a wonderful day. With Gratitude, Cherly Beach, DNP, AGNP-BC

## 2021-08-15 ENCOUNTER — Ambulatory Visit
Admission: RE | Admit: 2021-08-15 | Discharge: 2021-08-15 | Disposition: A | Discharge status: Home / Self Care | Payer: Managed Care, Other (non HMO) | Attending: Internal Medicine | Admitting: Internal Medicine

## 2021-08-15 ENCOUNTER — Other Ambulatory Visit: Payer: Self-pay

## 2021-08-15 ENCOUNTER — Ambulatory Visit
Admission: RE | Admit: 2021-08-15 | Discharge: 2021-08-15 | Disposition: A | Discharge status: Home / Self Care | Payer: Managed Care, Other (non HMO) | Source: Ambulatory Visit | Attending: Internal Medicine | Admitting: Internal Medicine

## 2021-08-15 ENCOUNTER — Ambulatory Visit (INDEPENDENT_AMBULATORY_CARE_PROVIDER_SITE_OTHER): Payer: Managed Care, Other (non HMO) | Admitting: Internal Medicine

## 2021-08-15 ENCOUNTER — Encounter: Payer: Self-pay | Admitting: Internal Medicine

## 2021-08-15 VITALS — BP 121/75 | HR 106 | Temp 97.7°F | Resp 20 | Ht 68.0 in | Wt 190.0 lb

## 2021-08-15 DIAGNOSIS — R509 Fever, unspecified: Secondary | ICD-10-CM | POA: Diagnosis not present

## 2021-08-15 DIAGNOSIS — R051 Acute cough: Secondary | ICD-10-CM | POA: Insufficient documentation

## 2021-08-15 DIAGNOSIS — J329 Chronic sinusitis, unspecified: Secondary | ICD-10-CM | POA: Diagnosis not present

## 2021-08-15 DIAGNOSIS — R0602 Shortness of breath: Secondary | ICD-10-CM | POA: Insufficient documentation

## 2021-08-15 LAB — POCT INFLUENZA A/B
Influenza A, POC: NEGATIVE
Influenza A, POC: NEGATIVE
Influenza B, POC: NEGATIVE
Influenza B, POC: NEGATIVE

## 2021-08-15 MED ORDER — PREDNISONE 10 MG PO TABS: ORAL_TABLET | ORAL | 0 refills | Status: DC

## 2021-08-15 MED ORDER — HYDROCODONE BIT-HOMATROP MBR 5-1.5 MG/5ML PO SOLN: 5.0000 mL | Freq: Three times a day (TID) | ORAL | 0 refills | Status: DC | PRN

## 2021-08-15 MED ORDER — ALBUTEROL SULFATE HFA 108 (90 BASE) MCG/ACT IN AERS: 2.0000 | INHALATION_SPRAY | Freq: Four times a day (QID) | RESPIRATORY_TRACT | 0 refills | Status: DC | PRN

## 2021-08-15 MED ORDER — LEVOFLOXACIN 500 MG PO TABS: 500.0000 mg | ORAL_TABLET | Freq: Every day | ORAL | 0 refills | Status: DC

## 2021-08-15 NOTE — Progress Notes (Signed)
HPI  Patient presents the clinic today with complaint of facial pain and pressure, runny nose, ear pain, sore throat and cough.  She reports this started 4 days ago.  She is blowing clear mucus out of her nose.  The cough is productive at times.  She reports shortness of breath with coughing spells.  She denies headache, nasal congestion, chest pain, nausea, vomiting or diarrhea.  She has had fever, chills and body aches.  She did recently travel to [place] but did not have flu or COVID exposure that she is aware of.  She reports she is up-to-date with her flu and COVID vaccines.  She had negative COVID test at home.  She reports this initially started 2 months ago.  She did an E-visit on 11/8, diagnosed with sinusitis and treated with Augmentin.  She had another ED visit, 11/28 diagnosed with a URI and treated with Flonase and Tessalon.  She did another E-visit 12/5 diagnosed with sinusitis and treated with Doxycycline and Prednisone. She had another Evisit 12/25 diagnosed with sinusitis, treated with Azithromycin.  Review of Systems     Past Medical History:  Diagnosis Date   Anxiety    Hyperlipidemia    Osteopenia 03/27/2017    Family History  Problem Relation Age of Onset   Diabetes Mother    Osteoporosis Mother    Diabetes Father    Hypertension Brother    Throat cancer Maternal Grandmother    Healthy Brother    Heart attack Neg Hx    Stroke Neg Hx    Breast cancer Neg Hx    Colon cancer Neg Hx    Ovarian cancer Neg Hx     Social History   Socioeconomic History   Marital status: Married    Spouse name: Not on file   Number of children: Not on file   Years of education: Not on file   Highest education level: Not on file  Occupational History   Not on file  Tobacco Use   Smoking status: Every Day    Packs/day: 0.50    Years: 30.00    Pack years: 15.00    Types: Cigarettes   Smokeless tobacco: Never  Vaping Use   Vaping Use: Never used  Substance and Sexual Activity    Alcohol use: No   Drug use: No   Sexual activity: Yes    Birth control/protection: None    Comment: mutually monogamous long-term relationship  Other Topics Concern   Not on file  Social History Narrative   Not on file   Social Determinants of Health   Financial Resource Strain: Not on file  Food Insecurity: Not on file  Transportation Needs: Not on file  Physical Activity: Not on file  Stress: Not on file  Social Connections: Not on file  Intimate Partner Violence: Not on file    Allergies  Allergen Reactions   Chlorhexidine Rash    Patient developed rash after surgery. She thinks it is related to surgical prep.      Constitutional: Positive fatigue and fever. Denies headache, abrupt weight changes.  HEENT:  Positive facial pain, runny nose, ear pain and sore throat. Denies eye redness, ringing in the ears, wax buildup, nasal congestion or bloody nose. Respiratory: Positive cough and shortness of breath. Denies difficulty breathing or shortness of breath.  Cardiovascular: Denies chest pain, chest tightness, palpitations or swelling in the hands or feet.   No other specific complaints in a complete review of systems (except as  listed in HPI above).  Objective:  BP 121/75 (BP Location: Right Arm, Patient Position: Sitting, Cuff Size: Normal)    Pulse (!) 106    Temp 97.7 F (36.5 C) (Temporal)    Resp 20    Ht 5\' 8"  (1.727 m)    Wt 190 lb (86.2 kg)    SpO2 97%    BMI 28.89 kg/m    General: Appears her stated age, appears unwell but in NAD. HEENT: Head: normal shape and size, frontal and maxillary sinus tenderness noted; Eyes: sclera white, no icterus, conjunctiva pink; Ears: Tm's gray and intact, normal light reflex; Nose: mucosa boggy and moist, septum midline; Throat/Mouth: + PND. Teeth present, mucosa erythematous and moist, no exudate noted, no lesions or ulcerations noted.  Neck:  No adenopathy noted.  Cardiovascular: Normal rate and rhythm. S1,S2 noted.  No murmur,  rubs or gallops noted.  Pulmonary/Chest: Normal effort.  Scattered rhonchi and expiratory wheezing in the right upper lobe. No respiratory distress. No wheezes, rales or ronchi noted.       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 Flonase 2 sprays each nostril for 3 days and then as needed. eRx for Augmentin BID for 10 days  RTC as needed or if symptoms persist. Webb Silversmith, NP This visit occurred during the SARS-CoV-2 public health emergency.  Safety protocols were in place, including screening questions prior to the visit, additional usage of staff PPE, and extensive cleaning of exam room while observing appropriate contact time as indicated for disinfecting solutions.

## 2021-08-15 NOTE — Patient Instructions (Signed)

## 2021-08-29 ENCOUNTER — Encounter: Payer: Self-pay | Admitting: Internal Medicine

## 2022-01-31 ENCOUNTER — Telehealth: Payer: Managed Care, Other (non HMO) | Admitting: Internal Medicine

## 2022-01-31 ENCOUNTER — Encounter: Payer: Self-pay | Admitting: Internal Medicine

## 2022-01-31 DIAGNOSIS — M5441 Lumbago with sciatica, right side: Secondary | ICD-10-CM

## 2022-01-31 MED ORDER — CYCLOBENZAPRINE HCL 10 MG PO TABS: 10.0000 mg | ORAL_TABLET | Freq: Every evening | ORAL | 0 refills | Status: DC | PRN

## 2022-01-31 MED ORDER — PREDNISONE 10 MG PO TABS: ORAL_TABLET | ORAL | 0 refills | Status: DC

## 2022-01-31 NOTE — Progress Notes (Signed)
Patient ID: Kelsey Key, female   DOB: 1961/07/05, 61 y.o.   MRN: [id number] Virtual Visit via Video Note  I connected with Kelsey Key on 01/31/22 at 11:20 AM EDT by a video enabled telemedicine application and verified that I am speaking with the correct person using two identifiers.  Location: Patient: Home Provider: Office  Persons participating in this video call: Webb Silversmith, NP and Rose Fillers.   I discussed the limitations of evaluation and management by telemedicine and the availability of in person appointments. The patient expressed understanding and agreed to proceed.  History of Present Illness:  Patient reports right side low back pain. This started 3 years ago. She describes the pain as dull and deep. The pain radiates into her right leg.  She denies numbness, tingling or weakness in the right lower extremity. She denies loss of bowel or bladder control. The pain keeps her up at night. She denies any specific injury to the area but has been working out in the yard a lot lately. She does have neuropathy but reports this feels different. She has taken Ibuprofen with minimal relief of symptoms. She has also taken Flexeril with some improvement in symptoms. She has had a massage and stretching with minimal relief of symptoms.   Past Medical History:  Diagnosis Date   Anxiety    Hyperlipidemia    Osteopenia 03/27/2017    Current Outpatient Medications  Medication Sig Dispense Refill   albuterol (VENTOLIN HFA) 108 (90 Base) MCG/ACT inhaler Inhale 2 puffs into the lungs every 6 (six) hours as needed for wheezing or shortness of breath. 8 g 0   ALPRAZolam (XANAX) 0.25 MG tablet Take 1 tablet (0.25 mg total) by mouth daily as needed for anxiety. (Patient not taking: Reported on 08/15/2021) 10 tablet 0   atorvastatin (LIPITOR) 40 MG tablet Take 1 tablet (40 mg total) by mouth daily. 90 tablet 3   calcium-vitamin D (OSCAL WITH D) 250-125 MG-UNIT tablet Take 1 tablet by mouth daily.      cetirizine (ZYRTEC) 10 MG tablet Take 1 tablet (10 mg total) by mouth daily. 90 tablet 3   docusate sodium (COLACE) 100 MG capsule Take 1 capsule (100 mg total) by mouth daily. 90 capsule 1   fluticasone (FLONASE) 50 MCG/ACT nasal spray Place 2 sprays into both nostrils daily. 9.9 mL 0   HYDROcodone bit-homatropine (HYCODAN) 5-1.5 MG/5ML syrup Take 5 mLs by mouth every 8 (eight) hours as needed for cough. 120 mL 0   levofloxacin (LEVAQUIN) 500 MG tablet Take 1 tablet (500 mg total) by mouth daily. For 7 days 7 tablet 0   montelukast (SINGULAIR) 10 MG tablet Take 1 tablet (10 mg total) by mouth at bedtime. 90 tablet 3   Multiple Vitamin (MULTIVITAMIN) capsule Take by mouth.     Omega-3 Fatty Acids (FISH OIL) 1200 MG CAPS Take by mouth.     predniSONE (DELTASONE) 10 MG tablet Take 3 tabs on days 1-3, 2 tabs on days 4-6, 1 tab on days 7-9 18 tablet 0   Probiotic CAPS      sertraline (ZOLOFT) 50 MG tablet Take 1 tablet (50 mg total) by mouth daily. 90 tablet 3   TURMERIC PO Take by mouth.     vitamin B-12 (CYANOCOBALAMIN) 100 MCG tablet Take 100 mcg by mouth daily.     vitamin E 400 UNIT capsule Take 400 Units by mouth daily.     No current facility-administered medications for this visit.  Allergies  Allergen Reactions   Chlorhexidine Rash    Patient developed rash after surgery. She thinks it is related to surgical prep.     Family History  Problem Relation Age of Onset   Diabetes Mother    Osteoporosis Mother    Diabetes Father    Hypertension Brother    Throat cancer Maternal Grandmother    Healthy Brother    Heart attack Neg Hx    Stroke Neg Hx    Breast cancer Neg Hx    Colon cancer Neg Hx    Ovarian cancer Neg Hx     Social History   Socioeconomic History   Marital status: Married    Spouse name: Not on file   Number of children: Not on file   Years of education: Not on file   Highest education level: Not on file  Occupational History   Not on file  Tobacco  Use   Smoking status: Every Day    Packs/day: 0.50    Years: 30.00    Total pack years: 15.00    Types: Cigarettes   Smokeless tobacco: Never  Vaping Use   Vaping Use: Never used  Substance and Sexual Activity   Alcohol use: No   Drug use: No   Sexual activity: Yes    Birth control/protection: None    Comment: mutually monogamous long-term relationship  Other Topics Concern   Not on file  Social History Narrative   Not on file   Social Determinants of Health   Financial Resource Strain: Not on file  Food Insecurity: Not on file  Transportation Needs: Not on file  Physical Activity: Not on file  Stress: Not on file  Social Connections: Not on file  Intimate Partner Violence: Not on file     Constitutional: Denies fever, malaise, fatigue, headache or abrupt weight changes.  Respiratory: Denies difficulty breathing, shortness of breath, cough or sputum production.   Cardiovascular: Denies chest pain, chest tightness, palpitations or swelling in the hands or feet.  Gastrointestinal: Denies abdominal pain, bloating, constipation, diarrhea or blood in the stool.  GU: Denies urgency, frequency, pain with urination, burning sensation, blood in urine, odor or discharge. Musculoskeletal: Pt reports right side low back pain, radiating into her right leg. Denies decrease in range of motion, difficulty with gait, muscle pain or joint pain and swelling.  Skin: Denies redness, rashes, lesions or ulcercations.  Neurological: Denies numbness, tingling, weakness or problems with balance and coordination.    No other specific complaints in a complete review of systems (except as listed in HPI above).   Observations/Objective:   Wt Readings from Last 3 Encounters:  08/15/21 190 lb (86.2 kg)  05/24/21 185 lb 12.8 oz (84.3 kg)  07/26/20 191 lb (86.6 kg)    General: Appears her stated age, well developed, well nourished in NAD. Pulmonary/Chest: Normal effort. No respiratory distress.   Musculoskeletal: Normal range of motion. No difficulty with gait. Neurological: Alert and oriented.   BMET    Component Value Date/Time   NA 140 02/25/2020 0815   K 4.9 02/25/2020 0815   CL 105 02/25/2020 0815   CO2 24 02/25/2020 0815   GLUCOSE 99 02/25/2020 0815   BUN 10 02/25/2020 0815   CREATININE 0.83 02/25/2020 0815   CALCIUM 9.5 02/25/2020 0815   GFRNONAA 77 02/25/2020 0815   GFRAA 89 02/25/2020 0815    Lipid Panel     Component Value Date/Time   CHOL 142 02/25/2020 0815   TRIG 121  02/25/2020 0815   HDL 47 02/25/2020 0815   CHOLHDL 3.0 02/25/2020 0815   LDLCALC 73 02/25/2020 0815    CBC    Component Value Date/Time   WBC 10.3 02/25/2020 0815   RBC 4.46 02/25/2020 0815   HGB 13.8 02/25/2020 0815   HCT 42.0 02/25/2020 0815   PLT 457 (H) 02/25/2020 0815   MCV 94 02/25/2020 0815   MCH 30.9 02/25/2020 0815   MCHC 32.9 02/25/2020 0815   RDW 13.1 02/25/2020 0815   LYMPHSABS 3.7 (H) 02/25/2020 0815   EOSABS 0.1 02/25/2020 0815   BASOSABS 0.1 02/25/2020 0815    Hgb A1C No results found for: "HGBA1C"     Assessment and Plan  Low Back Pain with Right Side Sciatica:  RX for Pred Taper x 9 days RX for Flexeril 10 mg QHS prn- sedation caution given Encouraged ice for 10 minutes 2 x day Continue stretching Consider x-ray lumbar spine if symptoms persist or worsen  Please schedule an appointment for your annual exam  Follow Up Instructions:    I discussed the assessment and treatment plan with the patient. The patient was provided an opportunity to ask questions and all were answered. The patient agreed with the plan and demonstrated an understanding of the instructions.   The patient was advised to call back or seek an in-person evaluation if the symptoms worsen or if the condition fails to improve as anticipated.    Webb Silversmith, NP

## 2022-01-31 NOTE — Patient Instructions (Signed)

## 2022-02-22 ENCOUNTER — Telehealth: Payer: Managed Care, Other (non HMO) | Admitting: Physician Assistant

## 2022-02-22 DIAGNOSIS — J019 Acute sinusitis, unspecified: Secondary | ICD-10-CM

## 2022-02-22 DIAGNOSIS — B9689 Other specified bacterial agents as the cause of diseases classified elsewhere: Secondary | ICD-10-CM

## 2022-02-22 MED ORDER — AMOXICILLIN-POT CLAVULANATE 875-125 MG PO TABS: 1.0000 | ORAL_TABLET | Freq: Two times a day (BID) | ORAL | 0 refills | Status: DC

## 2022-02-22 NOTE — Patient Instructions (Signed)
Kelsey Key, thank you for joining Mar Daring, PA-C for today's virtual visit.  While this provider is not your primary care provider (PCP), if your PCP is located in our provider database this encounter information will be shared with them immediately following your visit.  Consent: (Patient) Kelsey Key provided verbal consent for this virtual visit at the beginning of the encounter.  Current Medications:  Current Outpatient Medications:    amoxicillin-clavulanate (AUGMENTIN) 875-125 MG tablet, Take 1 tablet by mouth 2 (two) times daily., Disp: 14 tablet, Rfl: 0   ALPRAZolam (XANAX) 0.25 MG tablet, Take 1 tablet (0.25 mg total) by mouth daily as needed for anxiety., Disp: 10 tablet, Rfl: 0   atorvastatin (LIPITOR) 40 MG tablet, Take 1 tablet (40 mg total) by mouth daily., Disp: 90 tablet, Rfl: 3   calcium-vitamin D (OSCAL WITH D) 250-125 MG-UNIT tablet, Take 1 tablet by mouth daily., Disp: , Rfl:    cetirizine (ZYRTEC) 10 MG tablet, Take 1 tablet (10 mg total) by mouth daily., Disp: 90 tablet, Rfl: 3   cyclobenzaprine (FLEXERIL) 10 MG tablet, Take 1 tablet (10 mg total) by mouth at bedtime as needed for muscle spasms., Disp: 30 tablet, Rfl: 0   fluticasone (FLONASE) 50 MCG/ACT nasal spray, Place 2 sprays into both nostrils daily., Disp: 9.9 mL, Rfl: 0   montelukast (SINGULAIR) 10 MG tablet, Take 1 tablet (10 mg total) by mouth at bedtime., Disp: 90 tablet, Rfl: 3   Multiple Vitamin (MULTIVITAMIN) capsule, Take by mouth., Disp: , Rfl:    Omega-3 Fatty Acids (FISH OIL) 1200 MG CAPS, Take by mouth., Disp: , Rfl:    predniSONE (DELTASONE) 10 MG tablet, Take 3 tabs on days 1-3, 2 tabs on days 4-6, 1 tab on days 7-9, Disp: 18 tablet, Rfl: 0   Probiotic CAPS, , Disp: , Rfl:    sertraline (ZOLOFT) 50 MG tablet, Take 1 tablet (50 mg total) by mouth daily., Disp: 90 tablet, Rfl: 3   TURMERIC PO, Take by mouth., Disp: , Rfl:    vitamin B-12 (CYANOCOBALAMIN) 100 MCG tablet, Take 100 mcg by  mouth daily., Disp: , Rfl:    vitamin E 400 UNIT capsule, Take 400 Units by mouth daily., Disp: , Rfl:    Medications ordered in this encounter:  Meds ordered this encounter  Medications   amoxicillin-clavulanate (AUGMENTIN) 875-125 MG tablet    Sig: Take 1 tablet by mouth 2 (two) times daily.    Dispense:  14 tablet    Refill:  0    Order Specific Question:   Supervising Provider    Answer:   Sabra Heck, BRIAN [[id number]]     *If you need refills on other medications prior to your next appointment, please contact your pharmacy*  Follow-Up: Call back or seek an in-person evaluation if the symptoms worsen or if the condition fails to improve as anticipated.  Other Instructions Sinus Infection, Adult A sinus infection is soreness and swelling (inflammation) of your sinuses. Sinuses are hollow spaces in the bones around your face. They are located: Around your eyes. In the middle of your forehead. Behind your nose. In your cheekbones. Your sinuses and nasal passages are lined with a fluid called mucus. Mucus drains out of your sinuses. Swelling can trap mucus in your sinuses. This lets germs (bacteria, virus, or fungus) grow, which leads to infection. Most of the time, this condition is caused by a virus. What are the causes? Allergies. Asthma. Germs. Things that block your nose or  sinuses. Growths in the nose (nasal polyps). Chemicals or irritants in the air. A fungus. This is rare. What increases the risk? Having a weak body defense system (immune system). Doing a lot of swimming or diving. Using nasal sprays too much. Smoking. What are the signs or symptoms? The main symptoms of this condition are pain and a feeling of pressure around the sinuses. Other symptoms include: Stuffy nose (congestion). This may make it hard to breathe through your nose. Runny nose (drainage). Soreness, swelling, and warmth in the sinuses. A cough that may get worse at night. Being unable to smell and  taste. Mucus that collects in the throat or the back of the nose (postnasal drip). This may cause a sore throat or bad breath. Being very tired (fatigued). A fever. How is this diagnosed? Your symptoms. Your medical history. A physical exam. Tests to find out if your condition is short-term (acute) or long-term (chronic). Your doctor may: Check your nose for growths (polyps). Check your sinuses using a tool that has a light on one end (endoscope). Check for allergies or germs. Do imaging tests, such as an MRI or CT scan. How is this treated? Treatment for this condition depends on the cause and whether it is short-term or long-term. If caused by a virus, your symptoms should go away on their own within 10 days. You may be given medicines to relieve symptoms. They include: Medicines that shrink swollen tissue in the nose. A spray that treats swelling of the nostrils. Rinses that help get rid of thick mucus in your nose (nasal saline washes). Medicines that treat allergies (antihistamines). Over-the-counter pain relievers. If caused by bacteria, your doctor may wait to see if you will get better without treatment. You may be given antibiotic medicine if you have: A very bad infection. A weak body defense system. If caused by growths in the nose, surgery may be needed. Follow these instructions at home: Medicines Take, use, or apply over-the-counter and prescription medicines only as told by your doctor. These may include nasal sprays. If you were prescribed an antibiotic medicine, take it as told by your doctor. Do not stop taking it even if you start to feel better. Hydrate and humidify  Drink enough water to keep your pee (urine) pale yellow. Use a cool mist humidifier to keep the humidity level in your home above 50%. Breathe in steam for 10-15 minutes, 3-4 times a day, or as told by your doctor. You can do this in the bathroom while a hot shower is running. Try not to spend time  in cool or dry air. Rest Rest as much as you can. Sleep with your head raised (elevated). Make sure you get enough sleep each night. General instructions  Put a warm, moist washcloth on your face 3-4 times a day, or as often as told by your doctor. Use nasal saline washes as often as told by your doctor. Wash your hands often with soap and water. If you cannot use soap and water, use hand sanitizer. Do not smoke. Avoid being around people who are smoking (secondhand smoke). Keep all follow-up visits. Contact a doctor if: You have a fever. Your symptoms get worse. Your symptoms do not get better within 10 days. Get help right away if: You have a very bad headache. You cannot stop vomiting. You have very bad pain or swelling around your face or eyes. You have trouble seeing. You feel confused. Your neck is stiff. You have trouble breathing. These  symptoms may be an emergency. Get help right away. Call 911. Do not wait to see if the symptoms will go away. Do not drive yourself to the hospital. Summary A sinus infection is swelling of your sinuses. Sinuses are hollow spaces in the bones around your face. This condition is caused by tissues in your nose that become inflamed or swollen. This traps germs. These can lead to infection. If you were prescribed an antibiotic medicine, take it as told by your doctor. Do not stop taking it even if you start to feel better. Keep all follow-up visits. This information is not intended to replace advice given to you by your health care provider. Make sure you discuss any questions you have with your health care provider. Document Revised: 07/10/2021 Document Reviewed: 07/10/2021 Elsevier Patient Education  [place].    If you have been instructed to have an in-person evaluation today at a local Urgent Care facility, please use the link below. It will take you to a list of all of our available [facility], including address,  phone number and hours of operation. Please do not delay care.  [facility]  If you or a family member do not have a primary care provider, use the link below to schedule a visit and establish care. When you choose a [facility] primary care physician or advanced practice provider, you gain a long-term partner in health. Find a Primary Care Provider  Learn more about [facility]'s in-office and virtual care options: [facility]

## 2022-02-22 NOTE — Progress Notes (Signed)
Virtual Visit Consent   Kelsey Key, you are scheduled for a virtual visit with a [facility] provider today. Just as with appointments in the office, your consent must be obtained to participate. Your consent will be active for this visit and any virtual visit you may have with one of our providers in the next 365 days. If you have a MyChart account, a copy of this consent can be sent to you electronically.  As this is a virtual visit, video technology does not allow for your provider to perform a traditional examination. This may limit your provider's ability to fully assess your condition. If your provider identifies any concerns that need to be evaluated in person or the need to arrange testing (such as labs, EKG, etc.), we will make arrangements to do so. Although advances in technology are sophisticated, we cannot ensure that it will always work on either your end or our end. If the connection with a video visit is poor, the visit may have to be switched to a telephone visit. With either a video or telephone visit, we are not always able to ensure that we have a secure connection.  By engaging in this virtual visit, you consent to the provision of healthcare and authorize for your insurance to be billed (if applicable) for the services provided during this visit. Depending on your insurance coverage, you may receive a charge related to this service.  I need to obtain your verbal consent now. Are you willing to proceed with your visit today? Kelsey Key has provided verbal consent on 02/22/2022 for a virtual visit (video or telephone). Mar Daring, PA-C  Date: 02/22/2022 8:25 AM  Virtual Visit via Video Note   I, Mar Daring, connected with  Kelsey Key  ([id number], October 11, 1960) on 02/22/22 at  8:15 AM EDT by a video-enabled telemedicine application and verified that I am speaking with the correct person using two identifiers.  Location: Patient: Virtual Visit Location  Patient: Home Provider: Virtual Visit Location Provider: Home Office   I discussed the limitations of evaluation and management by telemedicine and the availability of in person appointments. The patient expressed understanding and agreed to proceed.    History of Present Illness: Kelsey Key is a 61 y.o. who identifies as a female who was assigned female at birth, and is being seen today for possible sinus infection.   HPI: Sinusitis This is a new problem. The current episode started 1 to 4 weeks ago (off and on over a week, but worsened over last 3 days). The problem has been gradually worsening since onset. There has been no fever. The pain is moderate. Associated symptoms include congestion, ear pain (right), headaches and sinus pressure. Pertinent negatives include no chills, coughing, hoarse voice, shortness of breath or sore throat. (Facial pain) Treatments tried: Ibuprofen. The treatment provided mild relief.      Problems:  Patient Active Problem List   Diagnosis Date Noted   Aortic atherosclerosis ([place]) 05/24/2021   Overweight with body mass index (BMI) of 28 to 28.9 in adult 05/24/2021   History of right breast cancer 05/24/2021   Situational anxiety 03/23/2018   Osteopenia 03/27/2017   Hyperlipidemia 03/27/2017   Panic disorder 03/27/2017   Thrombocytosis 03/16/2014    Allergies:  Allergies  Allergen Reactions   Chlorhexidine Rash    Patient developed rash after surgery. She thinks it is related to surgical prep.    Medications:  Current Outpatient Medications:  amoxicillin-clavulanate (AUGMENTIN) 875-125 MG tablet, Take 1 tablet by mouth 2 (two) times daily., Disp: 14 tablet, Rfl: 0   ALPRAZolam (XANAX) 0.25 MG tablet, Take 1 tablet (0.25 mg total) by mouth daily as needed for anxiety., Disp: 10 tablet, Rfl: 0   atorvastatin (LIPITOR) 40 MG tablet, Take 1 tablet (40 mg total) by mouth daily., Disp: 90 tablet, Rfl: 3   calcium-vitamin D (OSCAL WITH D) 250-125  MG-UNIT tablet, Take 1 tablet by mouth daily., Disp: , Rfl:    cetirizine (ZYRTEC) 10 MG tablet, Take 1 tablet (10 mg total) by mouth daily., Disp: 90 tablet, Rfl: 3   cyclobenzaprine (FLEXERIL) 10 MG tablet, Take 1 tablet (10 mg total) by mouth at bedtime as needed for muscle spasms., Disp: 30 tablet, Rfl: 0   fluticasone (FLONASE) 50 MCG/ACT nasal spray, Place 2 sprays into both nostrils daily., Disp: 9.9 mL, Rfl: 0   montelukast (SINGULAIR) 10 MG tablet, Take 1 tablet (10 mg total) by mouth at bedtime., Disp: 90 tablet, Rfl: 3   Multiple Vitamin (MULTIVITAMIN) capsule, Take by mouth., Disp: , Rfl:    Omega-3 Fatty Acids (FISH OIL) 1200 MG CAPS, Take by mouth., Disp: , Rfl:    predniSONE (DELTASONE) 10 MG tablet, Take 3 tabs on days 1-3, 2 tabs on days 4-6, 1 tab on days 7-9, Disp: 18 tablet, Rfl: 0   Probiotic CAPS, , Disp: , Rfl:    sertraline (ZOLOFT) 50 MG tablet, Take 1 tablet (50 mg total) by mouth daily., Disp: 90 tablet, Rfl: 3   TURMERIC PO, Take by mouth., Disp: , Rfl:    vitamin B-12 (CYANOCOBALAMIN) 100 MCG tablet, Take 100 mcg by mouth daily., Disp: , Rfl:    vitamin E 400 UNIT capsule, Take 400 Units by mouth daily., Disp: , Rfl:   Observations/Objective: Patient is well-developed, well-nourished in no acute distress.  Resting comfortably at home.  Head is normocephalic, atraumatic.  No labored breathing.  Speech is clear and coherent with logical content.  Patient is alert and oriented at baseline.    Assessment and Plan: 1. Acute bacterial sinusitis - amoxicillin-clavulanate (AUGMENTIN) 875-125 MG tablet; Take 1 tablet by mouth 2 (two) times daily.  Dispense: 14 tablet; Refill: 0  - Worsening symptoms that have not responded to OTC medications.  - Will give Augmentin - Continue allergy medications.  - Steam and humidifier can help - Stay well hydrated and get plenty of rest.  - Seek in person evaluation if no symptom improvement or if symptoms worsen   Follow Up  Instructions: I discussed the assessment and treatment plan with the patient. The patient was provided an opportunity to ask questions and all were answered. The patient agreed with the plan and demonstrated an understanding of the instructions.  A copy of instructions were sent to the patient via MyChart unless otherwise noted below.    The patient was advised to call back or seek an in-person evaluation if the symptoms worsen or if the condition fails to improve as anticipated.  Time:  I spent 8 minutes with the patient via telehealth technology discussing the above problems/concerns.    Mar Daring, PA-C

## 2022-05-17 ENCOUNTER — Encounter: Payer: Self-pay | Admitting: Internal Medicine

## 2022-05-17 ENCOUNTER — Ambulatory Visit (INDEPENDENT_AMBULATORY_CARE_PROVIDER_SITE_OTHER): Payer: Managed Care, Other (non HMO) | Admitting: Internal Medicine

## 2022-05-17 VITALS — BP 124/76 | HR 84 | Temp 96.8°F | Ht 68.0 in | Wt 193.0 lb

## 2022-05-17 DIAGNOSIS — E663 Overweight: Secondary | ICD-10-CM | POA: Diagnosis not present

## 2022-05-17 DIAGNOSIS — Z0001 Encounter for general adult medical examination with abnormal findings: Secondary | ICD-10-CM

## 2022-05-17 DIAGNOSIS — F410 Panic disorder [episodic paroxysmal anxiety] without agoraphobia: Secondary | ICD-10-CM

## 2022-05-17 DIAGNOSIS — Z23 Encounter for immunization: Secondary | ICD-10-CM

## 2022-05-17 DIAGNOSIS — F418 Other specified anxiety disorders: Secondary | ICD-10-CM

## 2022-05-17 DIAGNOSIS — Z6829 Body mass index (BMI) 29.0-29.9, adult: Secondary | ICD-10-CM

## 2022-05-17 MED ORDER — ALPRAZOLAM 0.25 MG PO TABS: 0.2500 mg | ORAL_TABLET | Freq: Every day | ORAL | 0 refills | Status: DC | PRN

## 2022-05-17 MED ORDER — ASPIRIN 81 MG PO TBEC: 81.0000 mg | DELAYED_RELEASE_TABLET | Freq: Every day | ORAL | 12 refills | Status: AC

## 2022-05-17 NOTE — Patient Instructions (Signed)
Health Maintenance After Age 62 After age 62, you are at a higher risk for certain long-term diseases and infections as well as injuries from falls. Falls are a major cause of broken bones and head injuries in people who are older than age 62. Getting regular preventive care can help to keep you healthy and well. Preventive care includes getting regular testing and making lifestyle changes as recommended by your health care provider. Talk with your health care provider about: Which screenings and tests you should have. A screening is a test that checks for a disease when you have no symptoms. A diet and exercise plan that is right for you. What should I know about screenings and tests to prevent falls? Screening and testing are the best ways to find a health problem early. Early diagnosis and treatment give you the best chance of managing medical conditions that are common after age 62. Certain conditions and lifestyle choices may make you more likely to have a fall. Your health care provider may recommend: Regular vision checks. Poor vision and conditions such as cataracts can make you more likely to have a fall. If you wear glasses, make sure to get your prescription updated if your vision changes. Medicine review. Work with your health care provider to regularly review all of the medicines you are taking, including over-the-counter medicines. Ask your health care provider about any side effects that may make you more likely to have a fall. Tell your health care provider if any medicines that you take make you feel dizzy or sleepy. Strength and balance checks. Your health care provider may recommend certain tests to check your strength and balance while standing, walking, or changing positions. Foot health exam. Foot pain and numbness, as well as not wearing proper footwear, can make you more likely to have a fall. Screenings, including: Osteoporosis screening. Osteoporosis is a condition that causes  the bones to get weaker and break more easily. Blood pressure screening. Blood pressure changes and medicines to control blood pressure can make you feel dizzy. Depression screening. You may be more likely to have a fall if you have a fear of falling, feel depressed, or feel unable to do activities that you used to do. Alcohol use screening. Using too much alcohol can affect your balance and may make you more likely to have a fall. Follow these instructions at home: Lifestyle Do not drink alcohol if: Your health care provider tells you not to drink. If you drink alcohol: Limit how much you have to: 0-1 drink a day for women. 0-2 drinks a day for men. Know how much alcohol is in your drink. In the [place] one drink equals one 12 oz bottle of beer (355 mL), one 5 oz glass of wine (148 mL), or one 1 oz glass of hard liquor (44 mL). Do not use any products that contain nicotine or tobacco. These products include cigarettes, chewing tobacco, and vaping devices, such as e-cigarettes. If you need help quitting, ask your health care provider. Activity  Follow a regular exercise program to stay fit. This will help you maintain your balance. Ask your health care provider what types of exercise are appropriate for you. If you need a cane or walker, use it as recommended by your health care provider. Wear supportive shoes that have nonskid soles. Safety  Remove any tripping hazards, such as rugs, cords, and clutter. Install safety equipment such as grab bars in bathrooms and safety rails on stairs. Keep rooms and walkways   well-lit. General instructions Talk with your health care provider about your risks for falling. Tell your health care provider if: You fall. Be sure to tell your health care provider about all falls, even ones that seem minor. You feel dizzy, tiredness (fatigue), or off-balance. Take over-the-counter and prescription medicines only as told by your health care provider. These include  supplements. Eat a healthy diet and maintain a healthy weight. A healthy diet includes low-fat dairy products, low-fat (lean) meats, and fiber from whole grains, beans, and lots of fruits and vegetables. Stay current with your vaccines. Schedule regular health, dental, and eye exams. Summary Having a healthy lifestyle and getting preventive care can help to protect your health and wellness after age 62. Screening and testing are the best way to find a health problem early and help you avoid having a fall. Early diagnosis and treatment give you the best chance for managing medical conditions that are more common for people who are older than age 62. Falls are a major cause of broken bones and head injuries in people who are older than age 62. Take precautions to prevent a fall at home. Work with your health care provider to learn what changes you can make to improve your health and wellness and to prevent falls. This information is not intended to replace advice given to you by your health care provider. Make sure you discuss any questions you have with your health care provider. Document Revised: 12/25/2020 Document Reviewed: 12/25/2020 Elsevier Patient Education  2023 Elsevier Inc.  

## 2022-05-17 NOTE — Assessment & Plan Note (Signed)
Encouraged diet and exercise for weight loss ?

## 2022-05-17 NOTE — Progress Notes (Signed)
Subjective:    Patient ID: Kelsey Key, female    DOB: 1961-05-27, 61 y.o.   MRN: [id number]  HPI  Patient presents to clinic today for her annual exam.  Flu: 05/2018 Tetanus: 03/2012 COVID: [place] x2 Pneumovax: 03/2012 Prevnar: 02/2014 Shingrix: Never Pap smear: Hysterectomy Mammogram: 05/2021 Bone density: 04/2017 Colon screening: 12/2014 Vision screening: annually Dentist: biannually  Diet: She does eat meat. She consumes more fruits than veggies. She tries to avoid fried foods. She drinks mostly decaf coffee, water, caffeine free soda Exercise: walking  Review of Systems     Past Medical History:  Diagnosis Date   Anxiety    Hyperlipidemia    Osteopenia 03/27/2017    Current Outpatient Medications  Medication Sig Dispense Refill   ALPRAZolam (XANAX) 0.25 MG tablet Take 1 tablet (0.25 mg total) by mouth daily as needed for anxiety. 10 tablet 0   amoxicillin-clavulanate (AUGMENTIN) 875-125 MG tablet Take 1 tablet by mouth 2 (two) times daily. 14 tablet 0   atorvastatin (LIPITOR) 40 MG tablet Take 1 tablet (40 mg total) by mouth daily. 90 tablet 3   calcium-vitamin D (OSCAL WITH D) 250-125 MG-UNIT tablet Take 1 tablet by mouth daily.     cetirizine (ZYRTEC) 10 MG tablet Take 1 tablet (10 mg total) by mouth daily. 90 tablet 3   cyclobenzaprine (FLEXERIL) 10 MG tablet Take 1 tablet (10 mg total) by mouth at bedtime as needed for muscle spasms. 30 tablet 0   fluticasone (FLONASE) 50 MCG/ACT nasal spray Place 2 sprays into both nostrils daily. 9.9 mL 0   montelukast (SINGULAIR) 10 MG tablet Take 1 tablet (10 mg total) by mouth at bedtime. 90 tablet 3   Multiple Vitamin (MULTIVITAMIN) capsule Take by mouth.     Omega-3 Fatty Acids (FISH OIL) 1200 MG CAPS Take by mouth.     predniSONE (DELTASONE) 10 MG tablet Take 3 tabs on days 1-3, 2 tabs on days 4-6, 1 tab on days 7-9 18 tablet 0   Probiotic CAPS      sertraline (ZOLOFT) 50 MG tablet Take 1 tablet (50 mg total) by mouth  daily. 90 tablet 3   TURMERIC PO Take by mouth.     vitamin B-12 (CYANOCOBALAMIN) 100 MCG tablet Take 100 mcg by mouth daily.     vitamin E 400 UNIT capsule Take 400 Units by mouth daily.     No current facility-administered medications for this visit.    Allergies  Allergen Reactions   Chlorhexidine Rash    Patient developed rash after surgery. She thinks it is related to surgical prep.     Family History  Problem Relation Age of Onset   Diabetes Mother    Osteoporosis Mother    Diabetes Father    Hypertension Brother    Throat cancer Maternal Grandmother    Healthy Brother    Heart attack Neg Hx    Stroke Neg Hx    Breast cancer Neg Hx    Colon cancer Neg Hx    Ovarian cancer Neg Hx     Social History   Socioeconomic History   Marital status: Married    Spouse name: Not on file   Number of children: Not on file   Years of education: Not on file   Highest education level: Not on file  Occupational History   Not on file  Tobacco Use   Smoking status: Every Day    Packs/day: 0.50    Years: 30.00  Total pack years: 15.00    Types: Cigarettes   Smokeless tobacco: Never  Vaping Use   Vaping Use: Never used  Substance and Sexual Activity   Alcohol use: No   Drug use: No   Sexual activity: Yes    Birth control/protection: None    Comment: mutually monogamous long-term relationship  Other Topics Concern   Not on file  Social History Narrative   Not on file   Social Determinants of Health   Financial Resource Strain: Not on file  Food Insecurity: Not on file  Transportation Needs: Not on file  Physical Activity: Not on file  Stress: Not on file  Social Connections: Not on file  Intimate Partner Violence: Not on file     Constitutional: Denies fever, malaise, fatigue, headache or abrupt weight changes.  HEENT: Denies eye pain, eye redness, ear pain, ringing in the ears, wax buildup, runny nose, nasal congestion, bloody nose, or sore  throat. Respiratory: Denies difficulty breathing, shortness of breath, cough or sputum production.   Cardiovascular: Denies chest pain, chest tightness, palpitations or swelling in the hands or feet.  Gastrointestinal: Denies abdominal pain, bloating, constipation, diarrhea or blood in the stool.  GU: Denies urgency, frequency, pain with urination, burning sensation, blood in urine, odor or discharge. Musculoskeletal: Denies decrease in range of motion, difficulty with gait, muscle pain or joint pain and swelling.  Skin: Denies redness, rashes, lesions or ulcercations.  Neurological: Pt reports paresthesia of lower extremities. Denies dizziness, difficulty with memory, difficulty with speech or problems with balance and coordination.  Psych: Patient has a history of anxiety.  Denies depression, SI/HI.  No other specific complaints in a complete review of systems (except as listed in HPI above).  Objective:   Physical Exam  BP 124/76 (BP Location: Left Arm, Patient Position: Sitting, Cuff Size: Normal)   Pulse 84   Temp (!) 96.8 F (36 C) (Temporal)   Ht _0  (1.727 m)   Wt 193 lb (87.5 kg)   SpO2 98%   BMI 29.35 kg/m   Wt Readings from Last 3 Encounters:  08/15/21 190 lb (86.2 kg)  05/24/21 185 lb 12.8 oz (84.3 kg)  07/26/20 191 lb (86.6 kg)    General: Appears her stated age, overweight, in NAD. Skin: Warm, dry and intact.  HEENT: Head: normal shape and size; Eyes: sclera white, no icterus, conjunctiva pink, PERRLA and EOMs intact;  Neck:  Neck supple, trachea midline. No masses, lumps or thyromegaly present.  Cardiovascular: Normal rate and rhythm. S1,S2 noted.  No murmur, rubs or gallops noted. No JVD or BLE edema. No carotid bruits noted. Pulmonary/Chest: Normal effort and positive vesicular breath sounds. No respiratory distress. No wheezes, rales or ronchi noted.  Abdomen: Normal bowel sounds.  Musculoskeletal: Strength 5/5 BUE/BLE. No difficulty with gait.   Neurological: Alert and oriented. Cranial nerves II-XII grossly intact. Coordination normal.  Psychiatric: Mood and affect normal. Behavior is normal. Judgment and thought content normal.    BMET    Component Value Date/Time   NA 140 02/25/2020 0815   K 4.9 02/25/2020 0815   CL 105 02/25/2020 0815   CO2 24 02/25/2020 0815   GLUCOSE 99 02/25/2020 0815   BUN 10 02/25/2020 0815   CREATININE 0.83 02/25/2020 0815   CALCIUM 9.5 02/25/2020 0815   GFRNONAA 77 02/25/2020 0815   GFRAA 89 02/25/2020 0815    Lipid Panel     Component Value Date/Time   CHOL 142 02/25/2020 0815   TRIG 121  02/25/2020 0815   HDL 47 02/25/2020 0815   CHOLHDL 3.0 02/25/2020 0815   LDLCALC 73 02/25/2020 0815    CBC    Component Value Date/Time   WBC 10.3 02/25/2020 0815   RBC 4.46 02/25/2020 0815   HGB 13.8 02/25/2020 0815   HCT 42.0 02/25/2020 0815   PLT 457 (H) 02/25/2020 0815   MCV 94 02/25/2020 0815   MCH 30.9 02/25/2020 0815   MCHC 32.9 02/25/2020 0815   RDW 13.1 02/25/2020 0815   LYMPHSABS 3.7 (H) 02/25/2020 0815   EOSABS 0.1 02/25/2020 0815   BASOSABS 0.1 02/25/2020 0815    Hgb A1C No results found for: "HGBA1C"         Assessment & Plan:   Preventative Health Maintenance:  She declines Flu shot today Tetanus today Encouraged her to get her COVID booster Would not recommend repeat pneumonia vaccines until she is 57 Discussed Shingrix vaccine, she will check coverage with her insurance company and schedule a nurse visit if she would like to have this done She no longer needs Pap smears Mammogram scheduled She declines bone density at this time Colon screening UTD Encouraged her to consume a balanced diet and exercise regimen Advised her to see an eye doctor and dentist annually We will check CBC, c-Met, lipid, A1c today  RTC in 6 months, follow-up chronic conditions Webb Silversmith, NP

## 2022-05-29 LAB — COMPREHENSIVE METABOLIC PANEL
ALT: 20 IU/L (ref 0–32)
AST: 16 IU/L (ref 0–40)
Albumin/Globulin Ratio: 2 (ref 1.2–2.2)
Albumin: 4.3 g/dL (ref 3.9–4.9)
Alkaline Phosphatase: 78 IU/L (ref 44–121)
BUN/Creatinine Ratio: 18 (ref 12–28)
BUN: 14 mg/dL (ref 8–27)
Bilirubin Total: 0.5 mg/dL (ref 0.0–1.2)
CO2: 22 mmol/L (ref 20–29)
Calcium: 9.3 mg/dL (ref 8.7–10.3)
Chloride: 105 mmol/L (ref 96–106)
Creatinine, Ser: 0.79 mg/dL (ref 0.57–1.00)
Globulin, Total: 2.2 g/dL (ref 1.5–4.5)
Glucose: 113 mg/dL — ABNORMAL HIGH (ref 70–99)
Potassium: 4.6 mmol/L (ref 3.5–5.2)
Sodium: 140 mmol/L (ref 134–144)
Total Protein: 6.5 g/dL (ref 6.0–8.5)
eGFR: 85 mL/min/{1.73_m2} (ref >59)

## 2022-05-29 LAB — CBC
Hematocrit: 40.3 % (ref 34.0–46.6)
Hemoglobin: 14 g/dL (ref 11.1–15.9)
MCH: 32 pg (ref 26.6–33.0)
MCHC: 34.7 g/dL (ref 31.5–35.7)
MCV: 92 fL (ref 79–97)
Platelets: 414 10*3/uL (ref 150–450)
RBC: 4.38 x10E6/uL (ref 3.77–5.28)
RDW: 12.1 % (ref 11.7–15.4)
WBC: 7.9 10*3/uL (ref 3.4–10.8)

## 2022-05-29 LAB — LIPID PANEL
Chol/HDL Ratio: 3.3 ratio (ref 0.0–4.4)
Cholesterol, Total: 154 mg/dL (ref 100–199)
HDL: 46 mg/dL (ref >39)
LDL Chol Calc (NIH): 84 mg/dL (ref 0–99)
Triglycerides: 134 mg/dL (ref 0–149)
VLDL Cholesterol Cal: 24 mg/dL (ref 5–40)

## 2022-05-29 LAB — HEMOGLOBIN A1C
Est. average glucose Bld gHb Est-mCnc: 123 mg/dL
Hgb A1c MFr Bld: 5.9 % — ABNORMAL HIGH (ref 4.8–5.6)

## 2022-06-02 ENCOUNTER — Other Ambulatory Visit: Payer: Self-pay | Admitting: Internal Medicine

## 2022-06-02 DIAGNOSIS — E782 Mixed hyperlipidemia: Secondary | ICD-10-CM

## 2022-06-03 NOTE — Telephone Encounter (Signed)
Requested Prescriptions  Pending Prescriptions Disp Refills  . atorvastatin (LIPITOR) 40 MG tablet [Pharmacy Med Name: ATORVASTATIN TABS '40MG'$ ] 90 tablet 0    Sig: TAKE 1 TABLET DAILY     Cardiovascular:  Antilipid - Statins Failed - 06/02/2022 11:07 AM      Failed - Lipid Panel in normal range within the last 12 months    Cholesterol, Total  Date Value Ref Range Status  05/28/2022 154 100 - 199 mg/dL Final   LDL Chol Calc (NIH)  Date Value Ref Range Status  05/28/2022 84 0 - 99 mg/dL Final   HDL  Date Value Ref Range Status  05/28/2022 46 >39 mg/dL Final   Triglycerides  Date Value Ref Range Status  05/28/2022 134 0 - 149 mg/dL Final         Passed - Patient is not pregnant      Passed - Valid encounter within last 12 months    Recent Outpatient Visits          2 weeks ago Encounter for general adult medical examination with abnormal findings   [facility] [place], Coralie Keens, NP   4 months ago Acute right-sided low back pain with right-sided sciatica   [place], NP   9 months ago Acute cough   [facility] [place], Coralie Keens, NP   1 year ago Aortic atherosclerosis [facility])   [facility] [place], Coralie Keens, NP   1 year ago Irritant contact dermatitis due to [place], Lupita Raider, [place]

## 2022-06-06 LAB — HM MAMMOGRAPHY

## 2022-07-07 ENCOUNTER — Other Ambulatory Visit: Payer: Self-pay | Admitting: Internal Medicine

## 2022-07-07 DIAGNOSIS — Z9109 Other allergy status, other than to drugs and biological substances: Secondary | ICD-10-CM

## 2022-07-07 DIAGNOSIS — F418 Other specified anxiety disorders: Secondary | ICD-10-CM

## 2022-07-07 DIAGNOSIS — F410 Panic disorder [episodic paroxysmal anxiety] without agoraphobia: Secondary | ICD-10-CM

## 2022-07-08 NOTE — Telephone Encounter (Signed)
Requested Prescriptions  Pending Prescriptions Disp Refills   sertraline (ZOLOFT) 50 MG tablet [Pharmacy Med Name: SERTRALINE HCL TABS '50MG'$ ] 90 tablet 1    Sig: TAKE 1 TABLET DAILY     Psychiatry:  Antidepressants - SSRI - sertraline Passed - 07/07/2022  8:22 AM      Passed - AST in normal range and within 360 days    AST  Date Value Ref Range Status  05/28/2022 16 0 - 40 IU/L Final         Passed - ALT in normal range and within 360 days    ALT  Date Value Ref Range Status  05/28/2022 20 0 - 32 IU/L Final         Passed - Completed PHQ-2 or PHQ-9 in the last 360 days      Passed - Valid encounter within last 6 months    Recent Outpatient Visits           1 month ago Encounter for general adult medical examination with abnormal findings   [facility] [place], Coralie Keens, NP   5 months ago Acute right-sided low back pain with right-sided sciatica   [facility] [place], Coralie Keens, NP   10 months ago Acute cough   [facility] [place], Coralie Keens, NP   1 year ago Aortic atherosclerosis [facility])   [facility] [place], Coralie Keens, NP   1 year ago Irritant contact dermatitis due to [place], Lupita Raider, FNP               montelukast (SINGULAIR) 10 MG tablet [Pharmacy Med Name: MONTELUKAST SODIUM TABS '10MG'$ ] 90 tablet 3    Sig: TAKE 1 TABLET AT BEDTIME     Pulmonology:  Leukotriene Inhibitors Passed - 07/07/2022  8:22 AM      Passed - Valid encounter within last 12 months    Recent Outpatient Visits           1 month ago Encounter for general adult medical examination with abnormal findings   [facility] [place], Coralie Keens, NP   5 months ago Acute right-sided low back pain with right-sided sciatica   [place], NP   10 months ago Acute cough   [facility] [place], Coralie Keens, NP   1 year ago Aortic atherosclerosis [facility])   [facility] [place], Coralie Keens, NP   1 year ago Irritant contact dermatitis due to [place], Lupita Raider, [place]

## 2022-09-15 ENCOUNTER — Other Ambulatory Visit: Payer: Self-pay | Admitting: Internal Medicine

## 2022-09-15 DIAGNOSIS — E782 Mixed hyperlipidemia: Secondary | ICD-10-CM

## 2022-09-16 NOTE — Telephone Encounter (Signed)
Requested Prescriptions  Pending Prescriptions Disp Refills   atorvastatin (LIPITOR) 40 MG tablet [Pharmacy Med Name: ATORVASTATIN TABS '40MG'$ ] 90 tablet 0    Sig: TAKE 1 TABLET DAILY     Cardiovascular:  Antilipid - Statins Failed - 09/15/2022  8:35 AM      Failed - Lipid Panel in normal range within the last 12 months    Cholesterol, Total  Date Value Ref Range Status  05/28/2022 154 100 - 199 mg/dL Final   LDL Chol Calc (NIH)  Date Value Ref Range Status  05/28/2022 84 0 - 99 mg/dL Final   HDL  Date Value Ref Range Status  05/28/2022 46 >39 mg/dL Final   Triglycerides  Date Value Ref Range Status  05/28/2022 134 0 - 149 mg/dL Final         Passed - Patient is not pregnant      Passed - Valid encounter within last 12 months    Recent Outpatient Visits           4 months ago Encounter for general adult medical examination with abnormal findings   [facility] [place], [place] W, NP   7 months ago Acute right-sided low back pain with right-sided sciatica   [facility] [place], Coralie Keens, NP   1 year ago Acute cough   [facility] [place], Coralie Keens, NP   1 year ago Aortic atherosclerosis [facility])   [facility] [place], Coralie Keens, NP   2 years ago Irritant contact dermatitis due to [facility] [place], Lupita Raider, [place]

## 2022-09-22 ENCOUNTER — Other Ambulatory Visit: Payer: Self-pay | Admitting: Internal Medicine

## 2022-09-24 NOTE — Telephone Encounter (Signed)
Requested medications are due for refill today.  Provider to determine  Requested medications are on the active medications list.  yes  Last refill. 07/16/2021  Future visit scheduled.   no  Notes to clinic.  Rx signed by Montine Circle.    Requested Prescriptions  Pending Prescriptions Disp Refills   fluticasone (FLONASE) 50 MCG/ACT nasal spray [Pharmacy Med Name: FLUTICASONE PROP NASAL SPRAY 16GM 50MCG] 48 g 3    Sig: USE 2 SPRAYS IN EACH NOSTRIL DAILY     Ear, Nose, and Throat: Nasal Preparations - Corticosteroids Passed - 09/22/2022  6:12 PM      Passed - Valid encounter within last 12 months    Recent Outpatient Visits           4 months ago Encounter for general adult medical examination with abnormal findings   [facility] [place], [place] W, NP   7 months ago Acute right-sided low back pain with right-sided sciatica   [facility] [place], Coralie Keens, NP   1 year ago Acute cough   [facility] [place], Coralie Keens, NP   1 year ago Aortic atherosclerosis [facility])   [facility] [place], Coralie Keens, NP   2 years ago Irritant contact dermatitis due to [facility] [place], Lupita Raider, [place]

## 2022-12-22 ENCOUNTER — Other Ambulatory Visit: Payer: Self-pay | Admitting: Internal Medicine

## 2022-12-22 DIAGNOSIS — E782 Mixed hyperlipidemia: Secondary | ICD-10-CM

## 2022-12-23 NOTE — Telephone Encounter (Signed)
Requested Prescriptions  Pending Prescriptions Disp Refills   atorvastatin (LIPITOR) 40 MG tablet [Pharmacy Med Name: ATORVASTATIN TABS 40MG ] 90 tablet 1    Sig: TAKE 1 TABLET DAILY     Cardiovascular:  Antilipid - Statins Failed - 12/22/2022 11:27 AM      Failed - Lipid Panel in normal range within the last 12 months    Cholesterol, Total  Date Value Ref Range Status  05/28/2022 154 100 - 199 mg/dL Final   LDL Chol Calc (NIH)  Date Value Ref Range Status  05/28/2022 84 0 - 99 mg/dL Final   HDL  Date Value Ref Range Status  05/28/2022 46 >39 mg/dL Final   Triglycerides  Date Value Ref Range Status  05/28/2022 134 0 - 149 mg/dL Final         Passed - Patient is not pregnant      Passed - Valid encounter within last 12 months    Recent Outpatient Visits           7 months ago Encounter for general adult medical examination with abnormal findings   [place] [facility] [place], [place] W, NP   10 months ago Acute right-sided low back pain with right-sided sciatica   [place] [facility] [place], Salvadore Oxford, NP   1 year ago Acute cough   [facility] [place], Salvadore Oxford, NP   1 year ago Aortic atherosclerosis [facility])   [facility] [place], Salvadore Oxford, NP   2 years ago Irritant contact dermatitis due to detergent   [facility], Jodelle Gross, [place]

## 2023-01-10 ENCOUNTER — Telehealth: Payer: Managed Care, Other (non HMO) | Admitting: Family Medicine

## 2023-01-10 DIAGNOSIS — R3989 Other symptoms and signs involving the genitourinary system: Secondary | ICD-10-CM | POA: Diagnosis not present

## 2023-01-10 MED ORDER — CEPHALEXIN 500 MG PO CAPS: 500.0000 mg | ORAL_CAPSULE | Freq: Two times a day (BID) | ORAL | 0 refills | Status: AC

## 2023-01-10 NOTE — Progress Notes (Signed)

## 2023-01-14 ENCOUNTER — Other Ambulatory Visit: Payer: Self-pay | Admitting: Internal Medicine

## 2023-01-14 DIAGNOSIS — F418 Other specified anxiety disorders: Secondary | ICD-10-CM

## 2023-01-14 DIAGNOSIS — F410 Panic disorder [episodic paroxysmal anxiety] without agoraphobia: Secondary | ICD-10-CM

## 2023-01-15 NOTE — Telephone Encounter (Signed)
Patient needs OV for additional refills.  Requested Prescriptions  Pending Prescriptions Disp Refills   sertraline (ZOLOFT) 50 MG tablet [Pharmacy Med Name: SERTRALINE HCL TABS 50MG ] 90 tablet 0    Sig: TAKE 1 TABLET DAILY     Psychiatry:  Antidepressants - SSRI - sertraline Failed - 01/14/2023  8:11 AM      Failed - Valid encounter within last 6 months    Recent Outpatient Visits           8 months ago Encounter for general adult medical examination with abnormal findings   [facility] [place], [place] W, NP   11 months ago Acute right-sided low back pain with right-sided sciatica   [place] [facility] [place], Salvadore Oxford, NP   1 year ago Acute cough   [facility] [place], [place] W, NP   1 year ago Aortic atherosclerosis [facility])   [place] [facility] [place], Salvadore Oxford, NP   2 years ago Irritant contact dermatitis due to detergent   [place] [facility], Jodelle Gross, FNP              Passed - AST in normal range and within 360 days    AST  Date Value Ref Range Status  05/28/2022 16 0 - 40 IU/L Final         Passed - ALT in normal range and within 360 days    ALT  Date Value Ref Range Status  05/28/2022 20 0 - 32 IU/L Final         Passed - Completed PHQ-2 or PHQ-9 in the last 360 days

## 2023-04-13 ENCOUNTER — Other Ambulatory Visit: Payer: Self-pay | Admitting: Internal Medicine

## 2023-04-13 DIAGNOSIS — F418 Other specified anxiety disorders: Secondary | ICD-10-CM

## 2023-04-13 DIAGNOSIS — F410 Panic disorder [episodic paroxysmal anxiety] without agoraphobia: Secondary | ICD-10-CM

## 2023-04-15 NOTE — Telephone Encounter (Addendum)
Requested medication (s) are due for refill today: yes  Requested medication (s) are on the active medication list: yes  Last refill:  12/26/22 #90   Future visit scheduled:no  Notes to clinic:  Needs OV- attempted to call pt but VM full- sent pt message message to make appt via MyChart.   Requested Prescriptions  Pending Prescriptions Disp Refills   sertraline (ZOLOFT) 50 MG tablet [Pharmacy Med Name: SERTRALINE HCL TABS 50MG ] 90 tablet 3    Sig: TAKE 1 TABLET DAILY     Psychiatry:  Antidepressants - SSRI - sertraline Failed - 04/13/2023 12:44 PM      Failed - Valid encounter within last 6 months    Recent Outpatient Visits           11 months ago Encounter for general adult medical examination with abnormal findings   [place] [place], [place] W, NP   1 year ago Acute right-sided low back pain with right-sided sciatica   [place] [facility] [place], Salvadore Oxford, NP   1 year ago Acute cough   [facility] [place], [place] W, NP   1 year ago Aortic atherosclerosis [facility])   [place] [facility] [place], Salvadore Oxford, NP   2 years ago Irritant contact dermatitis due to detergent    [facility], Jodelle Gross, FNP              Passed - AST in normal range and within 360 days    AST  Date Value Ref Range Status  05/28/2022 16 0 - 40 IU/L Final         Passed - ALT in normal range and within 360 days    ALT  Date Value Ref Range Status  05/28/2022 20 0 - 32 IU/L Final         Passed - Completed PHQ-2 or PHQ-9 in the last 360 days

## 2023-04-22 ENCOUNTER — Ambulatory Visit: Payer: Managed Care, Other (non HMO) | Admitting: Internal Medicine

## 2023-04-22 ENCOUNTER — Encounter: Payer: Self-pay | Admitting: Internal Medicine

## 2023-04-22 VITALS — BP 128/72 | HR 71 | Temp 95.7°F | Wt 193.0 lb

## 2023-04-22 DIAGNOSIS — M8589 Other specified disorders of bone density and structure, multiple sites: Secondary | ICD-10-CM

## 2023-04-22 DIAGNOSIS — R7303 Prediabetes: Secondary | ICD-10-CM | POA: Diagnosis not present

## 2023-04-22 DIAGNOSIS — I700 Atherosclerosis of aorta: Secondary | ICD-10-CM | POA: Diagnosis not present

## 2023-04-22 DIAGNOSIS — F410 Panic disorder [episodic paroxysmal anxiety] without agoraphobia: Secondary | ICD-10-CM

## 2023-04-22 DIAGNOSIS — E782 Mixed hyperlipidemia: Secondary | ICD-10-CM | POA: Diagnosis not present

## 2023-04-22 DIAGNOSIS — Z853 Personal history of malignant neoplasm of breast: Secondary | ICD-10-CM

## 2023-04-22 DIAGNOSIS — F418 Other specified anxiety disorders: Secondary | ICD-10-CM

## 2023-04-22 MED ORDER — SERTRALINE HCL 100 MG PO TABS: 100.0000 mg | ORAL_TABLET | Freq: Every day | ORAL | 1 refills | Status: DC

## 2023-04-22 NOTE — Assessment & Plan Note (Signed)
Continue calcium and vitamin D OTC Encouraged daily weightbearing exercise She will have her oncologist order her bone density

## 2023-04-22 NOTE — Assessment & Plan Note (Signed)
Will increase sertraline to 100 mg daily Continue Xanax as needed Support offered

## 2023-04-22 NOTE — Assessment & Plan Note (Signed)
C-Met and lipid profile today Encouraged her to consume a low-fat diet Continue atorvastatin and fish oil

## 2023-04-22 NOTE — Assessment & Plan Note (Signed)
Will increase sertraline to 100 mg daily Continue Xanax as needed

## 2023-04-22 NOTE — Assessment & Plan Note (Signed)
C-Met lipid profile today Encouraged her to consume a low-fat diet Continue atorvastatin, fish oil and aspirin

## 2023-04-22 NOTE — Progress Notes (Signed)
Subjective:    Patient ID: Kelsey Key, female    DOB: [birth year], 62 y.o.   MRN: [id number]  HPI  Patient presents to clinic today for follow-up of chronic conditions.  HLD with aortic atherosclerosis: Her last LDL was 84, triglycerides 956, 05/2022.  She denies myalgias on atorvastatin and fish oil. She is taking aspirin as well. She does not consume low-fat diet.  Osteopenia: She is taking calcium and vitamin D OTC.  She tries to get 30 minutes of weightbearing exercise daily.  Bone density from 04/2017 reviewed.  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 She does feel like this has been worse lately, partly due to her father's poor help. She is not currently seeing a therapist.  She denies depression, SI/HI.  History of breast cancer: In remission, status post lumpectomy, chemo and radiation.  She follows with [facility] oncology.  Prediabetes: Her last A1c was 5.9%, 05/2022.  She is not taking any oral diabetic medication at this time.  She does not check her sugars.    Review of Systems     Past Medical History:  Diagnosis Date   Anxiety    Hyperlipidemia    Osteopenia 03/27/2017    Current Outpatient Medications  Medication Sig Dispense Refill   ALPRAZolam (XANAX) 0.25 MG tablet Take 1 tablet (0.25 mg total) by mouth daily as needed for anxiety. 20 tablet 0   aspirin EC 81 MG tablet Take 1 tablet (81 mg total) by mouth daily. Swallow whole. 30 tablet 12   atorvastatin (LIPITOR) 40 MG tablet TAKE 1 TABLET DAILY 90 tablet 1   calcium-vitamin D (OSCAL WITH D) 250-125 MG-UNIT tablet Take 1 tablet by mouth daily.     cetirizine (ZYRTEC) 10 MG tablet Take 1 tablet (10 mg total) by mouth daily. 90 tablet 3   cyclobenzaprine (FLEXERIL) 10 MG tablet Take 1 tablet (10 mg total) by mouth at bedtime as needed for muscle spasms. 30 tablet 0   fluticasone (FLONASE) 50 MCG/ACT nasal spray USE 2 SPRAYS IN EACH NOSTRIL DAILY 48 g 3   montelukast (SINGULAIR) 10 MG  tablet TAKE 1 TABLET AT BEDTIME 90 tablet 3   Multiple Vitamin (MULTIVITAMIN) capsule Take by mouth.     Omega-3 Fatty Acids (FISH OIL) 1200 MG CAPS Take by mouth.     Probiotic CAPS      sertraline (ZOLOFT) 50 MG tablet TAKE 1 TABLET DAILY 30 tablet 0   TURMERIC PO Take by mouth.     vitamin B-12 (CYANOCOBALAMIN) 100 MCG tablet Take 100 mcg by mouth daily.     vitamin E 400 UNIT capsule Take 400 Units by mouth daily.     No current facility-administered medications for this visit.    Allergies  Allergen Reactions   Chlorhexidine Rash    Patient developed rash after surgery. She thinks it is related to surgical prep.     Family History  Problem Relation Age of Onset   Diabetes Mother    Osteoporosis Mother    Diabetes Father    Hypertension Brother    Throat cancer Maternal Grandmother    Healthy Brother    Heart attack Neg Hx    Stroke Neg Hx    Breast cancer Neg Hx    Colon cancer Neg Hx    Ovarian cancer Neg Hx     Social History   Socioeconomic History   Marital status: Married    Spouse name: Not on file   Number  of children: Not on file   Years of education: Not on file   Highest education level: Not on file  Occupational History   Not on file  Tobacco Use   Smoking status: Former    Current packs/day: 0.50    Average packs/day: 0.5 packs/day for 30.0 years (15.0 ttl pk-yrs)    Types: Cigarettes   Smokeless tobacco: Never  Vaping Use   Vaping status: Never Used  Substance and Sexual Activity   Alcohol use: No   Drug use: No   Sexual activity: Yes    Birth control/protection: None    Comment: mutually monogamous long-term relationship  Other Topics Concern   Not on file  Social History Narrative   Not on file   Social Determinants of Health   Financial Resource Strain: Not on file  Food Insecurity: Not on file  Transportation Needs: Not on file  Physical Activity: Not on file  Stress: Not on file  Social Connections: Not on file  Intimate  Partner Violence: Not on file     Constitutional: Denies fever, malaise, fatigue, headache or abrupt weight changes.  HEENT: Denies eye pain, eye redness, ear pain, ringing in the ears, wax buildup, runny nose, nasal congestion, bloody nose, or sore throat. Respiratory: Denies difficulty breathing, shortness of breath, cough or sputum production.   Cardiovascular: Denies chest pain, chest tightness, palpitations or swelling in the hands or feet.  Gastrointestinal: Denies abdominal pain, bloating, constipation, diarrhea or blood in the stool.  GU: Denies urgency, frequency, pain with urination, burning sensation, blood in urine, odor or discharge. Musculoskeletal: Denies decrease in range of motion, difficulty with gait, muscle pain or joint pain and swelling.  Skin: Denies redness, rashes, lesions or ulcercations.  Neurological: Patient reports paresthesia of feet.  Denies dizziness, difficulty with memory, difficulty with speech or problems with balance and coordination.  Psych: Patient has a history of anxiety.  Denies depression, SI/HI.  No other specific complaints in a complete review of systems (except as listed in HPI above).  Objective:   Physical Exam  BP 128/72 (BP Location: Left Arm, Patient Position: Sitting, Cuff Size: Normal)   Pulse 71   Temp (!) 95.7 F (35.4 C) (Temporal)   Wt 193 lb (87.5 kg)   SpO2 97%   BMI 29.35 kg/m   Wt Readings from Last 3 Encounters:  05/17/22 193 lb (87.5 kg)  08/15/21 190 lb (86.2 kg)  05/24/21 185 lb 12.8 oz (84.3 kg)    General: Appears her stated age, overweight, in NAD. Skin: Warm, dry and intact. HEENT: Head: normal shape and size; Eyes: sclera white, no icterus, conjunctiva pink, PERRLA and EOMs intact;  Cardiovascular: Normal rate and rhythm. S1,S2 noted.  No murmur, rubs or gallops noted. No JVD or BLE edema. No carotid bruits noted. Pulmonary/Chest: Normal effort and positive vesicular breath sounds. No respiratory distress.  No wheezes, rales or ronchi noted.  Musculoskeletal: No difficulty with gait.  Neurological: Alert and oriented. Coordination normal.  Psychiatric: Mood and affect normal. Behavior is normal. Judgment and thought content normal.    BMET    Component Value Date/Time   NA 140 05/28/2022 0836   K 4.6 05/28/2022 0836   CL 105 05/28/2022 0836   CO2 22 05/28/2022 0836   GLUCOSE 113 (H) 05/28/2022 0836   BUN 14 05/28/2022 0836   CREATININE 0.79 05/28/2022 0836   CALCIUM 9.3 05/28/2022 0836   GFRNONAA 77 02/25/2020 0815   GFRAA 89 02/25/2020 0815  Lipid Panel     Component Value Date/Time   CHOL 154 05/28/2022 0836   TRIG 134 05/28/2022 0836   HDL 46 05/28/2022 0836   CHOLHDL 3.3 05/28/2022 0836   LDLCALC 84 05/28/2022 0836    CBC    Component Value Date/Time   WBC 7.9 05/28/2022 0836   RBC 4.38 05/28/2022 0836   HGB 14.0 05/28/2022 0836   HCT 40.3 05/28/2022 0836   PLT 414 05/28/2022 0836   MCV 92 05/28/2022 0836   MCH 32.0 05/28/2022 0836   MCHC 34.7 05/28/2022 0836   RDW 12.1 05/28/2022 0836   LYMPHSABS 3.7 (H) 02/25/2020 0815   EOSABS 0.1 02/25/2020 0815   BASOSABS 0.1 02/25/2020 0815    Hgb A1C Lab Results  Component Value Date   HGBA1C 5.9 (H) 05/28/2022            Assessment & Plan:     Schedule an appointment for your annual exam Nicki Reaper, NP

## 2023-04-22 NOTE — Patient Instructions (Signed)

## 2023-04-22 NOTE — Assessment & Plan Note (Signed)
In remission.

## 2023-04-23 LAB — LIPID PANEL
Cholesterol: 129 mg/dL (ref <200)
HDL: 50 mg/dL (ref >=50)
LDL Cholesterol (Calc): 57 mg/dL
Non-HDL Cholesterol (Calc): 79 mg/dL (ref <130)
Total CHOL/HDL Ratio: 2.6 (calc) (ref <5.0)
Triglycerides: 139 mg/dL (ref <150)

## 2023-04-23 LAB — CBC
HCT: 42 % (ref 35.0–45.0)
Hemoglobin: 14.2 g/dL (ref 11.7–15.5)
MCH: 31.8 pg (ref 27.0–33.0)
MCHC: 33.8 g/dL (ref 32.0–36.0)
MCV: 94.2 fL (ref 80.0–100.0)
MPV: 10.7 fL (ref 7.5–12.5)
Platelets: 398 10*3/uL (ref 140–400)
RBC: 4.46 10*6/uL (ref 3.80–5.10)
RDW: 12.7 % (ref 11.0–15.0)
WBC: 8 10*3/uL (ref 3.8–10.8)

## 2023-04-23 LAB — COMPLETE METABOLIC PANEL WITH GFR
AG Ratio: 1.8 (calc) (ref 1.0–2.5)
ALT: 16 U/L (ref 6–29)
AST: 14 U/L (ref 10–35)
Albumin: 4.2 g/dL (ref 3.6–5.1)
Alkaline phosphatase (APISO): 63 U/L (ref 37–153)
BUN: 14 mg/dL (ref 7–25)
CO2: 27 mmol/L (ref 20–32)
Calcium: 9.4 mg/dL (ref 8.6–10.4)
Chloride: 106 mmol/L (ref 98–110)
Creat: 0.74 mg/dL (ref 0.50–1.05)
Globulin: 2.4 g/dL (ref 1.9–3.7)
Glucose, Bld: 95 mg/dL (ref 65–139)
Potassium: 4.4 mmol/L (ref 3.5–5.3)
Sodium: 141 mmol/L (ref 135–146)
Total Bilirubin: 0.4 mg/dL (ref 0.2–1.2)
Total Protein: 6.6 g/dL (ref 6.1–8.1)
eGFR: 91 mL/min/{1.73_m2} (ref >=60)

## 2023-04-23 LAB — HEMOGLOBIN A1C
Hgb A1c MFr Bld: 5.9 %{Hb} — ABNORMAL HIGH (ref <5.7)
Mean Plasma Glucose: 123 mg/dL
eAG (mmol/L): 6.8 mmol/L

## 2023-05-07 ENCOUNTER — Encounter: Payer: Self-pay | Admitting: Internal Medicine

## 2023-05-08 MED ORDER — HYDROXYZINE HCL 10 MG PO TABS: 10.0000 mg | ORAL_TABLET | Freq: Three times a day (TID) | ORAL | 0 refills | Status: DC | PRN

## 2023-05-08 NOTE — Addendum Note (Signed)
Addended by: Lorre Munroe on: 05/08/2023 11:09 AM   Modules accepted: Orders

## 2023-06-29 ENCOUNTER — Other Ambulatory Visit: Payer: Self-pay | Admitting: Internal Medicine

## 2023-06-29 DIAGNOSIS — E782 Mixed hyperlipidemia: Secondary | ICD-10-CM

## 2023-07-01 NOTE — Telephone Encounter (Signed)
Requested Prescriptions  Pending Prescriptions Disp Refills   atorvastatin (LIPITOR) 40 MG tablet [Pharmacy Med Name: ATORVASTATIN TABS 40MG ] 90 tablet 3    Sig: TAKE 1 TABLET DAILY     Cardiovascular:  Antilipid - Statins Failed - 06/29/2023 10:31 AM      Failed - Lipid Panel in normal range within the last 12 months    Cholesterol, Total  Date Value Ref Range Status  05/28/2022 154 100 - 199 mg/dL Final   Cholesterol  Date Value Ref Range Status  04/22/2023 129 <200 mg/dL Final   LDL Cholesterol (Calc)  Date Value Ref Range Status  04/22/2023 57 mg/dL (calc) Final    Comment:    Reference range: <100 . Desirable range <100 mg/dL for primary prevention;   <70 mg/dL for patients with CHD or diabetic patients  with > or = 2 CHD risk factors. Marland Kitchen LDL-C is now calculated using the Martin-Hopkins  calculation, which is a validated novel method providing  better accuracy than the Friedewald equation in the  estimation of LDL-C.  Horald Pollen et al. Lenox Ahr. 1610;960(45): 2061-2068  (http://education.[facility].com/faq/FAQ164)    HDL  Date Value Ref Range Status  04/22/2023 50 > OR = 50 mg/dL Final  40/98/1191 46 >47 mg/dL Final   Triglycerides  Date Value Ref Range Status  04/22/2023 139 <150 mg/dL Final         Passed - Patient is not pregnant      Passed - Valid encounter within last 12 months    Recent Outpatient Visits           2 months ago Mixed hyperlipidemia   [place] [facility] [place], Salvadore Oxford, NP   1 year ago Encounter for general adult medical examination with abnormal findings   [place] [facility] [place], Salvadore Oxford, NP   1 year ago Acute right-sided low back pain with right-sided sciatica   [place] [facility] [place], Salvadore Oxford, NP   1 year ago Acute cough   [place] [facility] [place], Salvadore Oxford, NP   2 years ago Aortic atherosclerosis [facility])   [place] [facility] [place], Salvadore Oxford, NP       Future Appointments             In 3 months Ba[place], Salvadore Oxford, NP [place] [facility], [facility]

## 2023-07-21 LAB — HM MAMMOGRAPHY

## 2023-07-27 ENCOUNTER — Other Ambulatory Visit: Payer: Self-pay | Admitting: Internal Medicine

## 2023-07-27 DIAGNOSIS — Z9109 Other allergy status, other than to drugs and biological substances: Secondary | ICD-10-CM

## 2023-07-29 NOTE — Telephone Encounter (Signed)
Requested Prescriptions  Pending Prescriptions Disp Refills   montelukast (SINGULAIR) 10 MG tablet [Pharmacy Med Name: MONTELUKAST SODIUM TABS 10MG ] 90 tablet 0    Sig: TAKE 1 TABLET AT BEDTIME     Pulmonology:  Leukotriene Inhibitors Passed - 07/27/2023  9:03 AM      Passed - Valid encounter within last 12 months    Recent Outpatient Visits           3 months ago Mixed hyperlipidemia   [place] [facility] [place], Salvadore Oxford, NP   1 year ago Encounter for general adult medical examination with abnormal findings   [place] [facility] [place], Salvadore Oxford, NP   1 year ago Acute right-sided low back pain with right-sided sciatica   [place] [facility] [place], Salvadore Oxford, NP   1 year ago Acute cough   [place] [facility] [place], Salvadore Oxford, NP   2 years ago Aortic atherosclerosis [facility])   [place] [facility] [place], Salvadore Oxford, NP       Future Appointments             In 2 months Baity, Salvadore Oxford, NP [place] [facility], [facility]

## 2023-09-29 ENCOUNTER — Ambulatory Visit: Payer: Self-pay | Admitting: *Deleted

## 2023-09-29 ENCOUNTER — Encounter: Payer: Self-pay | Admitting: Family Medicine

## 2023-09-29 ENCOUNTER — Telehealth: Payer: Managed Care, Other (non HMO) | Admitting: Family Medicine

## 2023-09-29 DIAGNOSIS — J9801 Acute bronchospasm: Secondary | ICD-10-CM

## 2023-09-29 DIAGNOSIS — J0110 Acute frontal sinusitis, unspecified: Secondary | ICD-10-CM

## 2023-09-29 MED ORDER — AMOXICILLIN-POT CLAVULANATE 875-125 MG PO TABS
1.0000 | ORAL_TABLET | Freq: Two times a day (BID) | ORAL | 0 refills | Status: DC
Start: 2023-09-29 — End: 2023-10-20

## 2023-09-29 MED ORDER — GUAIFENESIN-CODEINE 100-10 MG/5ML PO SOLN: 5.0000 mL | Freq: Three times a day (TID) | ORAL | 0 refills | Status: DC | PRN

## 2023-09-29 MED ORDER — PREDNISONE 20 MG PO TABS: ORAL_TABLET | ORAL | 0 refills | Status: DC

## 2023-09-29 NOTE — Progress Notes (Signed)
 Subjective:    Patient ID: Kelsey Key, female    DOB: [birth year], 63 y.o.   MRN: [id number]  Kelsey Key is a 63 y.o. female presenting on 09/29/2023 for URI   Virtual / Telehealth Encounter - Video Visit via MyChart The purpose of this virtual visit is to provide medical care while limiting exposure to the novel coronavirus (COVID19) for both patient and office staff.  Consent was obtained for remote visit:  Yes.   Answered questions that patient had about telehealth interaction:  Yes.   I discussed the limitations, risks, security and privacy concerns of performing an evaluation and management service by video/telephone. I also discussed with the patient that there may be a patient responsible charge related to this service. The patient expressed understanding and agreed to proceed.  Patient Location: Home Provider Location: [facility] (Office)  Participants in virtual visit: - Patient: Kelsey Key - CMA: Nolberto Batty CMA - Provider: Dr Romeo Co   HPI  Discussed the use of AI scribe software for clinical note transcription with the patient, who gave verbal consent to proceed.  History of Present Illness   Kelsey Key is a 63 year old female with asplenia who presents with worsening flu-like symptoms.  She visited an urgent care on Saturday where she was tested for COVID-19 and influenza, both of which were negative. Despite the negative tests, she was prescribed Tamiflu. Since then, her symptoms have progressively worsened.  She is experiencing a low-grade fever, with the highest recorded temperature being 102.103F. She has not eaten in three days and is experiencing vomiting and dry heaving, primarily due to severe coughing spells. The cough is described as 'awful,' causing pain in her sides, dizziness, and shortness of breath after coughing. She feels generally weak and is consuming Boost and Gatorade to maintain hydration and nutrition.  She is  currently taking Mucinex , Tylenol, and Tamiflu. She has two and a half more days of Tamiflu remaining. She also uses an albuterol  inhaler, taking two puffs every four to six hours, which provides temporary relief from her cough.  She has a history of asplenia, which raises her concern when she becomes ill. She has previously taken prednisone  and has done well with Augmentin  for sinus infections. No known asthma or other lung conditions. She uses Flonase  for nasal congestion.          04/22/2023    1:54 PM 05/17/2022    3:43 PM 01/31/2022   11:00 AM  Depression screen PHQ 2/9  Decreased Interest 0 0 0  Down, Depressed, Hopeless 0 0 0  PHQ - 2 Score 0 0 0  Altered sleeping  1 0  Tired, decreased energy  1 0  Change in appetite  0 0  Feeling bad or failure about yourself   0 0  Trouble concentrating  0 0  Moving slowly or fidgety/restless  0 0  Suicidal thoughts  0 0  PHQ-9 Score  2 0  Difficult doing work/chores  Not difficult at all        04/22/2023    1:54 PM 05/17/2022    3:44 PM 05/24/2021    8:11 AM 12/28/2019    1:37 PM  GAD 7 : Generalized Anxiety Score  Nervous, Anxious, on Edge 1 0 1 0  Control/stop worrying 0 0 1 0  Worry too much - different things 0 0 0 0  Trouble relaxing 0 0 1 0  Restless 0 0 0 0  Easily annoyed or irritable 0 0 0 0  Afraid - awful might happen 0 0 1 0  Total GAD 7 Score 1 0 4 0  Anxiety Difficulty Not difficult at all Not difficult at all Not difficult at all Not difficult at all    Social History   Tobacco Use   Smoking status: Former    Current packs/day: 0.50    Average packs/day: 0.5 packs/day for 30.0 years (15.0 ttl pk-yrs)    Types: Cigarettes   Smokeless tobacco: Never  Vaping Use   Vaping status: Never Used  Substance Use Topics   Alcohol use: No   Drug use: No    Review of Systems Per HPI unless specifically indicated above     Objective:    There were no vitals taken for this visit.  Wt Readings from Last 3 Encounters:   04/22/23 193 lb (87.5 kg)  05/17/22 193 lb (87.5 kg)  08/15/21 190 lb (86.2 kg)     Physical Exam  Note examination was completely remotely via video observation objective data only  Unable to view patient's video screen seems camera not functional on their end  Respiratory observed evidence of coughing   Results for orders placed or performed in visit on 04/22/23  HM MAMMOGRAPHY   Collection Time: 06/06/22 12:00 AM  Result Value Ref Range   HM Mammogram 0-4 Bi-Rad 0-4 Bi-Rad, Self Reported Normal  CBC   Collection Time: 04/22/23  1:55 PM  Result Value Ref Range   WBC 8.0 3.8 - 10.8 Thousand/uL   RBC 4.46 3.80 - 5.10 Million/uL   Hemoglobin 14.2 11.7 - 15.5 g/dL   HCT 40.9 81.1 - 91.4 %   MCV 94.2 80.0 - 100.0 fL   MCH 31.8 27.0 - 33.0 pg   MCHC 33.8 32.0 - 36.0 g/dL   RDW 78.2 95.6 - 21.3 %   Platelets 398 140 - 400 Thousand/uL   MPV 10.7 7.5 - 12.5 fL  COMPLETE METABOLIC PANEL WITH GFR   Collection Time: 04/22/23  1:55 PM  Result Value Ref Range   Glucose, Bld 95 65 - 139 mg/dL   BUN 14 7 - 25 mg/dL   Creat 0.86 5.78 - 4.69 mg/dL   eGFR 91 > OR = 60 GE/XBM/8.41L2   BUN/Creatinine Ratio SEE NOTE: 6 - 22 (calc)   Sodium 141 135 - 146 mmol/L   Potassium 4.4 3.5 - 5.3 mmol/L   Chloride 106 98 - 110 mmol/L   CO2 27 20 - 32 mmol/L   Calcium  9.4 8.6 - 10.4 mg/dL   Total Protein 6.6 6.1 - 8.1 g/dL   Albumin 4.2 3.6 - 5.1 g/dL   Globulin 2.4 1.9 - 3.7 g/dL (calc)   AG Ratio 1.8 1.0 - 2.5 (calc)   Total Bilirubin 0.4 0.2 - 1.2 mg/dL   Alkaline phosphatase (APISO) 63 37 - 153 U/L   AST 14 10 - 35 U/L   ALT 16 6 - 29 U/L  Lipid panel   Collection Time: 04/22/23  1:55 PM  Result Value Ref Range   Cholesterol 129 <200 mg/dL   HDL 50 > OR = 50 mg/dL   Triglycerides 440 <102 mg/dL   LDL Cholesterol (Calc) 57 mg/dL (calc)   Total CHOL/HDL Ratio 2.6 <5.0 (calc)   Non-HDL Cholesterol (Calc) 79 <725 mg/dL (calc)  Hemoglobin D6U   Collection Time: 04/22/23  1:55 PM   Result Value Ref Range   Hgb A1c MFr Bld 5.9 (H) <5.7 % of  total Hgb   Mean Plasma Glucose 123 mg/dL   eAG (mmol/L) 6.8 mmol/L      Assessment & Plan:   Problem List Items Addressed This Visit   None Visit Diagnoses       Acute non-recurrent frontal sinusitis    -  Primary   Relevant Medications   guaiFENesin -codeine  (VIRTUSSIN A/C) 100-10 MG/5ML syrup   predniSONE  (DELTASONE ) 20 MG tablet   amoxicillin -clavulanate (AUGMENTIN ) 875-125 MG tablet     Cough due to bronchospasm       Relevant Medications   guaiFENesin -codeine  (VIRTUSSIN A/C) 100-10 MG/5ML syrup   predniSONE  (DELTASONE ) 20 MG tablet        Influenza-like Illness Severe cough causing pain and dizziness, low-grade fever, vomiting, and general weakness. Negative for COVID and flu tests but clinical presentation suggests a severe viral syndrome. Patient is currently on Tamiflu, Mucinex , and Tylenol. -Continue Tamiflu, Mucinex , and Tylenol. -Prescribe codeine  cough syrup for nighttime use to aid sleep. -Prescribe a 7-day steroid taper to help suppress the cough and manage respiratory bronchospasm  Asplenia Fever with known etiology respiratory -Consider starting Augmentin  if symptoms do not improve in 24-48 hours.  Asthma-like Symptoms Severe coughing spells causing dry heaving, similar to an asthma reaction. Patient is currently using an albuterol  inhaler. -Continue using albuterol  inhaler as needed. -Consider increasing frequency of albuterol  use to every 4 hours if cough persists.  Postnasal Drip Possible contributor to cough. Patient currently has Flonase . -Continue Flonase  to reduce congestion and postnasal drainage.  Follow-up Patient to reach out with any questions or concerns.         No orders of the defined types were placed in this encounter.   Meds ordered this encounter  Medications   guaiFENesin -codeine  (VIRTUSSIN A/C) 100-10 MG/5ML syrup    Sig: Take 5 mLs by mouth 3 (three) times daily  as needed.    Dispense:  120 mL    Refill:  0   predniSONE  (DELTASONE ) 20 MG tablet    Sig: Take daily with food. Start with 60mg  (3 pills) x 2 days, then reduce to 40mg  (2 pills) x 2 days, then 20mg  (1 pill) x 3 days    Dispense:  13 tablet    Refill:  0   amoxicillin -clavulanate (AUGMENTIN ) 875-125 MG tablet    Sig: Take 1 tablet by mouth 2 (two) times daily.    Dispense:  20 tablet    Refill:  0    Follow up plan: Return if symptoms worsen or fail to improve.   Patient verbalizes understanding with the above medical recommendations including the limitation of remote medical advice.  Specific follow-up and call-back criteria were given for patient to follow-up or seek medical care more urgently if needed.  Total duration of direct patient care provided via video conference: 10 minutes   Domingo Friend, DO [facility] 09/29/2023, 4:37 PM

## 2023-09-29 NOTE — Patient Instructions (Signed)
   Please schedule a Follow-up Appointment to: No follow-ups on file.  If you have any other questions or concerns, please feel free to call the office or send a message through MyChart. You may also schedule an earlier appointment if necessary.  Additionally, you may be receiving a survey about your experience at our office within a few days to 1 week by e-mail or mail. We value your feedback.  Saralyn Pilar, DO [facility], [place]

## 2023-09-29 NOTE — Telephone Encounter (Signed)
 Message from [place] M sent at 09/29/2023 12:01 PM EST  Summary: Diarrhea Advice   Pt is calling to report that the pt went to [facility] tested negative for flu but has flu like sx of temp, diarrhea, body aches. Pt was given tama flu. Pts husband reports that the patient has not eaten anything but Key piece of bread yesterday and today. There is Key 4:00p virtual appt today. Can the patient be scheudled with virtual with Dr. Linnell Richardson          Call History  Contact Date/Time Type Contact Phone/Fax By  09/29/2023 11:57 AM EST Phone (Incoming) Kelsey Key (Emergency Contact) [phone] Kelsey Key) Kelsey Key   Reason for Disposition  [1] MODERATE diarrhea (e.g., 4-6 times / day more than normal) AND [2] present > 48 hours (2 days)  Answer Assessment - Initial Assessment Questions 1. DIARRHEA SEVERITY: "How bad is the diarrhea?" "How many more stools have you had in the past 24 hours than normal?"    - NO DIARRHEA (SCALE 0)   - MILD (SCALE 1-3): Few loose or mushy BMs; increase of 1-3 stools over normal daily number of stools; mild increase in ostomy output.   -  MODERATE (SCALE 4-7): Increase of 4-6 stools daily over normal; moderate increase in ostomy output.   -  SEVERE (SCALE 8-10; OR "WORST POSSIBLE"): Increase of 7 or more stools daily over normal; moderate increase in ostomy output; incontinence.     Returned call to husband.   Diarrhea started today.   She is on Tamiflu.   She has Key non productive cough but today she is coughing up some stuff.   She has not eaten anything except 1/2 slice of bread and nothing today.   I got her some Boost and Gator Aid.    I don't know what is wrong with her.   Tested for flu and Covid and it's negative.   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   2. ONSET: "When did the diarrhea begin?"      Today diarrhea started.    She had chemotherapy 4 yrs.    3. BM CONSISTENCY: "How loose or watery is the diarrhea?"      Tamiflu started on Sunday.    4. VOMITING: "Are you also  vomiting?" If Yes, ask: "How many times in the past 24 hours?"      Not vomiting.    No appetite.    She is having coughing that leads to vomiting.    5. ABDOMEN PAIN: "Are you having any abdomen pain?" If Yes, ask: "What does it feel like?" (e.g., crampy, dull, intermittent, constant)      She is sore from coughing so much. 6. ABDOMEN PAIN SEVERITY: If present, ask: "How bad is the pain?"  (e.g., Scale 1-10; mild, moderate, or severe)   - MILD (1-3): doesn't interfere with normal activities, abdomen soft and not tender to touch    - MODERATE (4-7): interferes with normal activities or awakens from sleep, abdomen tender to touch    - SEVERE (8-10): excruciating pain, doubled over, unable to do any normal activities       No abd pain 7. ORAL INTAKE: If vomiting, "Have you been able to drink liquids?" "How much liquids have you had in the past 24 hours?"     Not eaten anything for 2 days no Friday night starting feeling bad.   8. HYDRATION: "Any signs of dehydration?" (e.g., dry mouth [not just dry lips], too weak  to stand, dizziness, new weight loss) "When did you last urinate?"     She's not eating or drinking. I've read they will prescribe Tamiflu for the spouse since I've been exposed. 9. EXPOSURE: "Have you traveled to Key foreign country recently?" "Have you been exposed to anyone with diarrhea?" "Could you have eaten any food that was spoiled?"     No 10. ANTIBIOTIC USE: "Are you taking antibiotics now or have you taken antibiotics in the past 2 months?"       Not asked 11. OTHER SYMPTOMS: "Do you have any other symptoms?" (e.g., fever, blood in stool)       See above 12. PREGNANCY: "Is there any chance you are pregnant?" "When was your last menstrual period?"       N/Key due to age  Protocols used: Diarrhea-Key-AH  Chief Complaint: No appetite, not eaten anything except 1/2 piece of bread yesterday.  Tested negative for flu and Covid.  Been 48 hours since she has eaten anything.  Symptoms:  Fever, body aches.   Diarrhea started today.   Started on Tamiflu yesterday.    She is coughing Key lot and today its become productive.  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 Frequency: Started feeling bad Friday evening. Pertinent Negatives: Patient denies having any appetite Disposition: [] ED /[] Urgent Care (no appt availability in office) / [x] Appointment(In office/virtual)/ []  [facility]/ [] Home Care/ [] Refused Recommended Disposition /[] [facility]/ []  Follow-up with PCP Additional Notes: Key virtual appt made for today with Dr. Romeo Co at 4:00.    (Husband originally had this appt but cancelled it when Dr. Romeo Co called in Tamiflu for him due to his exposure to his wife).

## 2023-09-30 ENCOUNTER — Telehealth: Payer: Self-pay

## 2023-09-30 ENCOUNTER — Other Ambulatory Visit: Payer: Self-pay | Admitting: Internal Medicine

## 2023-09-30 DIAGNOSIS — J9801 Acute bronchospasm: Secondary | ICD-10-CM

## 2023-09-30 MED ORDER — GUAIFENESIN-CODEINE 100-10 MG/5ML PO SOLN: 5.0000 mL | Freq: Three times a day (TID) | ORAL | 0 refills | Status: DC | PRN

## 2023-09-30 NOTE — Telephone Encounter (Signed)
This med is that was sent to [facility] but they are out of it, pt is requesting it be sent to [facility] [address] [place], [place] [address].

## 2023-09-30 NOTE — Telephone Encounter (Signed)
Ok. Does she prefer a different pharmacy where we can send it?  Saralyn Pilar, DO [facility] 09/30/2023, 12:58 PM

## 2023-09-30 NOTE — Addendum Note (Signed)
Addended by: Smitty Cords on: 09/30/2023 01:14 PM   Modules accepted: Orders

## 2023-09-30 NOTE — Telephone Encounter (Signed)
Medication Refill -  Most Recent Primary Care Visit:  Provider: Lorre Munroe  Department: [id number]  Visit Type: OFFICE VISIT  Date: 04/22/2023  Medication: guaiFENesin-codeine (VIRTUSSIN A/C) 100-10 MG/5ML syrup   Has the patient contacted their pharmacy? yes (Agent: If yes, when and what did the pharmacy advise?)contact pcp This med is that was sent to [facility] but they are out of it, so pt is requesting it be sent to [facility] [address], [place], [place] [address]  Is this the correct pharmacy for this prescription? yes  This is the patient's preferred pharmacy:  [facility] [address], [place], [place] [address]  Has the prescription been filled recently? no  Is the patient out of the medication? yes  Has the patient been seen for an appointment in the last year OR does the patient have an upcoming appointment? yes  Can we respond through MyChart? yes  Agent: Please be advised that Rx refills may take up to 3 business days. We ask that you follow-up with your pharmacy.

## 2023-09-30 NOTE — Telephone Encounter (Signed)
Pt spouse stated that he is trying to get the Rx for  guaiFENesin-codeine (VIRTUSSIN A/C) 100-10 MG/5ML syrup filled but he is being told that the Rx has not been received at [facility]. Pt spouse requests call back asap. Cb# [phone]

## 2023-10-20 ENCOUNTER — Encounter: Payer: Self-pay | Admitting: Internal Medicine

## 2023-10-20 ENCOUNTER — Ambulatory Visit: Payer: Managed Care, Other (non HMO) | Admitting: Internal Medicine

## 2023-10-20 VITALS — BP 122/72 | Ht 68.0 in | Wt 190.0 lb

## 2023-10-20 DIAGNOSIS — Z0001 Encounter for general adult medical examination with abnormal findings: Secondary | ICD-10-CM | POA: Diagnosis not present

## 2023-10-20 DIAGNOSIS — F410 Panic disorder [episodic paroxysmal anxiety] without agoraphobia: Secondary | ICD-10-CM

## 2023-10-20 DIAGNOSIS — E782 Mixed hyperlipidemia: Secondary | ICD-10-CM | POA: Diagnosis not present

## 2023-10-20 DIAGNOSIS — R7303 Prediabetes: Secondary | ICD-10-CM | POA: Diagnosis not present

## 2023-10-20 DIAGNOSIS — Z6828 Body mass index (BMI) 28.0-28.9, adult: Secondary | ICD-10-CM

## 2023-10-20 DIAGNOSIS — F418 Other specified anxiety disorders: Secondary | ICD-10-CM

## 2023-10-20 DIAGNOSIS — E663 Overweight: Secondary | ICD-10-CM

## 2023-10-20 MED ORDER — ALPRAZOLAM 0.25 MG PO TABS: 0.2500 mg | ORAL_TABLET | Freq: Every day | ORAL | 0 refills | Status: AC | PRN

## 2023-10-20 NOTE — Assessment & Plan Note (Signed)
 Encouraged diet and exercise for weight loss ?

## 2023-10-20 NOTE — Progress Notes (Signed)
 Subjective:    Patient ID: Kelsey Key, female    DOB: Sep 15, 1960, 63 y.o.   MRN: [id number]  HPI  Patient presents to clinic today for her annual exam.  Flu: 05/2023 COVID: Pfizer x2 Pneumovax: 03/2012 Prevnar: 02/2014 Shingrix: Never Pap smear: Hysterectomy Mammogram: 07/2023 Bone density: 04/2017 Colon screening: 12/2014 Vision screening: annually Dentist: biannually  Diet: She does eat meat. She consumes more fruits than veggies. She tries to avoid fried foods. She drinks mostly decaf coffee, water, caffeine free soda Exercise: walking  Review of Systems     Past Medical History:  Diagnosis Date   Anxiety    Hyperlipidemia    Osteopenia 03/27/2017    Current Outpatient Medications  Medication Sig Dispense Refill   albuterol (VENTOLIN HFA) 108 (90 Base) MCG/ACT inhaler Inhale 2 puffs into the lungs every 6 (six) hours as needed for wheezing or shortness of breath.     ALPRAZolam (XANAX) 0.25 MG tablet Take 1 tablet (0.25 mg total) by mouth daily as needed for anxiety. 20 tablet 0   amoxicillin-clavulanate (AUGMENTIN) 875-125 MG tablet Take 1 tablet by mouth 2 (two) times daily. 20 tablet 0   aspirin EC 81 MG tablet Take 1 tablet (81 mg total) by mouth daily. Swallow whole. 30 tablet 12   atorvastatin (LIPITOR) 40 MG tablet TAKE 1 TABLET DAILY 90 tablet 3   calcium-vitamin D (OSCAL WITH D) 250-125 MG-UNIT tablet Take 1 tablet by mouth daily.     cetirizine (ZYRTEC) 10 MG tablet Take 1 tablet (10 mg total) by mouth daily. 90 tablet 3   cyclobenzaprine (FLEXERIL) 10 MG tablet Take 1 tablet (10 mg total) by mouth at bedtime as needed for muscle spasms. 30 tablet 0   fluticasone (FLONASE) 50 MCG/ACT nasal spray USE 2 SPRAYS IN EACH NOSTRIL DAILY 48 g 3   guaiFENesin-codeine (VIRTUSSIN A/C) 100-10 MG/5ML syrup Take 5 mLs by mouth 3 (three) times daily as needed. 120 mL 0   hydrOXYzine (ATARAX) 10 MG tablet Take 1 tablet (10 mg total) by mouth 3 (three) times daily as needed.  90 tablet 0   montelukast (SINGULAIR) 10 MG tablet TAKE 1 TABLET AT BEDTIME 90 tablet 0   Multiple Vitamin (MULTIVITAMIN) capsule Take by mouth.     Omega-3 Fatty Acids (FISH OIL) 1200 MG CAPS Take by mouth.     predniSONE (DELTASONE) 20 MG tablet Take daily with food. Start with 60mg  (3 pills) x 2 days, then reduce to 40mg  (2 pills) x 2 days, then 20mg  (1 pill) x 3 days 13 tablet 0   Probiotic CAPS      sertraline (ZOLOFT) 100 MG tablet Take 1 tablet (100 mg total) by mouth daily. 90 tablet 1   TURMERIC PO Take by mouth.     vitamin B-12 (CYANOCOBALAMIN) 100 MCG tablet Take 100 mcg by mouth daily.     vitamin E 400 UNIT capsule Take 400 Units by mouth daily.     No current facility-administered medications for this visit.    Allergies  Allergen Reactions   Chlorhexidine Rash    Patient developed rash after surgery. She thinks it is related to surgical prep.     Family History  Problem Relation Age of Onset   Diabetes Mother    Osteoporosis Mother    Diabetes Father    Hypertension Brother    Throat cancer Maternal Grandmother    Healthy Brother    Heart attack Neg Hx    Stroke Neg Hx  Breast cancer Neg Hx    Colon cancer Neg Hx    Ovarian cancer Neg Hx     Social History   Socioeconomic History   Marital status: Married    Spouse name: Not on file   Number of children: Not on file   Years of education: Not on file   Highest education level: Not on file  Occupational History   Not on file  Tobacco Use   Smoking status: Former    Current packs/day: 0.50    Average packs/day: 0.5 packs/day for 30.0 years (15.0 ttl pk-yrs)    Types: Cigarettes   Smokeless tobacco: Never  Vaping Use   Vaping status: Never Used  Substance and Sexual Activity   Alcohol use: No   Drug use: No   Sexual activity: Yes    Birth control/protection: None    Comment: mutually monogamous long-term relationship  Other Topics Concern   Not on file  Social History Narrative   Not on file    Social Drivers of Health   Financial Resource Strain: Not on file  Food Insecurity: Not on file  Transportation Needs: Not on file  Physical Activity: Not on file  Stress: Not on file  Social Connections: Not on file  Intimate Partner Violence: Not on file     Constitutional: Denies fever, malaise, fatigue, headache or abrupt weight changes.  HEENT: Denies eye pain, eye redness, ear pain, ringing in the ears, wax buildup, runny nose, nasal congestion, bloody nose, or sore throat. Respiratory: Denies difficulty breathing, shortness of breath, cough or sputum production.   Cardiovascular: Denies chest pain, chest tightness, palpitations or swelling in the hands or feet.  Gastrointestinal: Pt reports constipation. Denies abdominal pain, bloating, diarrhea or blood in the stool.  GU: Denies urgency, frequency, pain with urination, burning sensation, blood in urine, odor or discharge. Musculoskeletal: Denies decrease in range of motion, difficulty with gait, muscle pain or joint pain and swelling.  Skin: Denies redness, rashes, lesions or ulcercations.  Neurological: Pt reports paresthesia of lower extremities. Denies dizziness, difficulty with memory, difficulty with speech or problems with balance and coordination.  Psych: Patient has a history of anxiety.  Denies depression, SI/HI.  No other specific complaints in a complete review of systems (except as listed in HPI above).  Objective:   Physical Exam BP 122/72 (BP Location: Left Arm, Patient Position: Sitting, Cuff Size: Normal)   Ht 5\' 8"  (1.727 m)   Wt 190 lb (86.2 kg)   BMI 28.89 kg/m    Wt Readings from Last 3 Encounters:  04/22/23 193 lb (87.5 kg)  05/17/22 193 lb (87.5 kg)  08/15/21 190 lb (86.2 kg)    General: Appears her stated age, overweight, in NAD. Skin: Warm, dry and intact.  HEENT: Head: normal shape and size; Eyes: sclera white, no icterus, conjunctiva pink, PERRLA and EOMs intact;  Neck:  Neck supple,  trachea midline. No masses, lumps or thyromegaly present.  Cardiovascular: Normal rate and rhythm. S1,S2 noted.  No murmur, rubs or gallops noted. No JVD or BLE edema. No carotid bruits noted. Pulmonary/Chest: Normal effort and positive vesicular breath sounds. No respiratory distress. No wheezes, rales or ronchi noted.  Abdomen: Normal bowel sounds.  Musculoskeletal: Strength 5/5 BUE/BLE. No difficulty with gait.  Neurological: Alert and oriented. Cranial nerves II-XII grossly intact. Coordination normal.  Psychiatric: Mood and affect normal. Behavior is normal. Judgment and thought content normal.    BMET    Component Value Date/Time   NA  141 04/22/2023 1355   NA 140 05/28/2022 0836   K 4.4 04/22/2023 1355   CL 106 04/22/2023 1355   CO2 27 04/22/2023 1355   GLUCOSE 95 04/22/2023 1355   BUN 14 04/22/2023 1355   BUN 14 05/28/2022 0836   CREATININE 0.74 04/22/2023 1355   CALCIUM 9.4 04/22/2023 1355   GFRNONAA 77 02/25/2020 0815   GFRAA 89 02/25/2020 0815    Lipid Panel     Component Value Date/Time   CHOL 129 04/22/2023 1355   CHOL 154 05/28/2022 0836   TRIG 139 04/22/2023 1355   HDL 50 04/22/2023 1355   HDL 46 05/28/2022 0836   CHOLHDL 2.6 04/22/2023 1355   LDLCALC 57 04/22/2023 1355    CBC    Component Value Date/Time   WBC 8.0 04/22/2023 1355   RBC 4.46 04/22/2023 1355   HGB 14.2 04/22/2023 1355   HGB 14.0 05/28/2022 0836   HCT 42.0 04/22/2023 1355   HCT 40.3 05/28/2022 0836   PLT 398 04/22/2023 1355   PLT 414 05/28/2022 0836   MCV 94.2 04/22/2023 1355   MCV 92 05/28/2022 0836   MCH 31.8 04/22/2023 1355   MCHC 33.8 04/22/2023 1355   RDW 12.7 04/22/2023 1355   RDW 12.1 05/28/2022 0836   LYMPHSABS 3.7 (H) 02/25/2020 0815   EOSABS 0.1 02/25/2020 0815   BASOSABS 0.1 02/25/2020 0815    Hgb A1C Lab Results  Component Value Date   HGBA1C 5.9 (H) 04/22/2023           Assessment & Plan:   Preventative Health Maintenance:  Flu shot UTD Tetanus  UTD Encouraged her to get her COVID booster Pneumovax and Prenvar 13 UTD She declines Prenvar 20 Discussed Shingrix vaccine, she will check coverage with her insurance company and schedule a nurse visit if she would like to have this done She no longer needs Pap smears Mammogram ordered by oncology She will have oncology order her pregnancy as well Colon screening UTD Encouraged her to consume a balanced diet and exercise regimen Advised her to see an eye doctor and dentist annually We will check CBC, c-Met, lipid, A1c today  RTC in 6 months, follow-up chronic conditions Nicki Reaper, NP

## 2023-10-20 NOTE — Patient Instructions (Signed)
 Health Maintenance for Postmenopausal Women Menopause is a normal process in which your ability to get pregnant comes to an end. This process happens slowly over many months or years, usually between the ages of 24 and 62. Menopause is complete when you have missed your menstrual period for 12 months. It is important to talk with your health care provider about some of the most common conditions that affect women after menopause (postmenopausal women). These include heart disease, cancer, and bone loss (osteoporosis). Adopting a healthy lifestyle and getting preventive care can help to promote your health and wellness. The actions you take can also lower your chances of developing some of these common conditions. What are the signs and symptoms of menopause? During menopause, you may have the following symptoms: Hot flashes. These can be moderate or severe. Night sweats. Decrease in sex drive. Mood swings. Headaches. Tiredness (fatigue). Irritability. Memory problems. Problems falling asleep or staying asleep. Talk with your health care provider about treatment options for your symptoms. Do I need hormone replacement therapy? Hormone replacement therapy is effective in treating symptoms that are caused by menopause, such as hot flashes and night sweats. Hormone replacement carries certain risks, especially as you become older. If you are thinking about using estrogen or estrogen with progestin, discuss the benefits and risks with your health care provider. How can I reduce my risk for heart disease and stroke? The risk of heart disease, heart attack, and stroke increases as you age. One of the causes may be a change in the body's hormones during menopause. This can affect how your body uses dietary fats, triglycerides, and cholesterol. Heart attack and stroke are medical emergencies. There are many things that you can do to help prevent heart disease and stroke. Watch your blood pressure High  blood pressure causes heart disease and increases the risk of stroke. This is more likely to develop in people who have high blood pressure readings or are overweight. Have your blood pressure checked: Every 3-5 years if you are 50-75 years of age. Every year if you are 77 years old or older. Eat a healthy diet  Eat a diet that includes plenty of vegetables, fruits, low-fat dairy products, and lean protein. Do not eat a lot of foods that are high in solid fats, added sugars, or sodium. Get regular exercise Get regular exercise. This is one of the most important things you can do for your health. Most adults should: Try to exercise for at least 150 minutes each week. The exercise should increase your heart rate and make you sweat (moderate-intensity exercise). Try to do strengthening exercises at least twice each week. Do these in addition to the moderate-intensity exercise. Spend less time sitting. Even light physical activity can be beneficial. Other tips Work with your health care provider to achieve or maintain a healthy weight. Do not use any products that contain nicotine or tobacco. These products include cigarettes, chewing tobacco, and vaping devices, such as e-cigarettes. If you need help quitting, ask your health care provider. Know your numbers. Ask your health care provider to check your cholesterol and your blood sugar (glucose). Continue to have your blood tested as directed by your health care provider. Do I need screening for cancer? Depending on your health history and family history, you may need to have cancer screenings at different stages of your life. This may include screening for: Breast cancer. Cervical cancer. Lung cancer. Colorectal cancer. What is my risk for osteoporosis? After menopause, you may be  at increased risk for osteoporosis. Osteoporosis is a condition in which bone destruction happens more quickly than new bone creation. To help prevent osteoporosis or  the bone fractures that can happen because of osteoporosis, you may take the following actions: If you are 61-3 years old, get at least 1,000 mg of calcium and at least 600 international units (IU) of vitamin D per day. If you are older than age 61 but younger than age 75, get at least 1,200 mg of calcium and at least 600 international units (IU) of vitamin D per day. If you are older than age 62, get at least 1,200 mg of calcium and at least 800 international units (IU) of vitamin D per day. Smoking and drinking excessive alcohol increase the risk of osteoporosis. Eat foods that are rich in calcium and vitamin D, and do weight-bearing exercises several times each week as directed by your health care provider. How does menopause affect my mental health? Depression may occur at any age, but it is more common as you become older. Common symptoms of depression include: Feeling depressed. Changes in sleep patterns. Changes in appetite or eating patterns. Feeling an overall lack of motivation or enjoyment of activities that you previously enjoyed. Frequent crying spells. Talk with your health care provider if you think that you are experiencing any of these symptoms. General instructions See your health care provider for regular wellness exams and vaccines. This may include: Scheduling regular health, dental, and eye exams. Getting and maintaining your vaccines. These include: Influenza vaccine. Get this vaccine each year before the flu season begins. Pneumonia vaccine. Shingles vaccine. Tetanus, diphtheria, and pertussis (Tdap) booster vaccine. Your health care provider may also recommend other immunizations. Tell your health care provider if you have ever been abused or do not feel safe at home. Summary Menopause is a normal process in which your ability to get pregnant comes to an end. This condition causes hot flashes, night sweats, decreased interest in sex, mood swings, headaches, or lack  of sleep. Treatment for this condition may include hormone replacement therapy. Take actions to keep yourself healthy, including exercising regularly, eating a healthy diet, watching your weight, and checking your blood pressure and blood sugar levels. Get screened for cancer and depression. Make sure that you are up to date with all your vaccines. This information is not intended to replace advice given to you by your health care provider. Make sure you discuss any questions you have with your health care provider. Document Revised: 12/25/2020 Document Reviewed: 12/25/2020 Elsevier Patient Education  2024 [employer].

## 2023-11-02 ENCOUNTER — Other Ambulatory Visit: Payer: Self-pay | Admitting: Internal Medicine

## 2023-11-02 DIAGNOSIS — Z9109 Other allergy status, other than to drugs and biological substances: Secondary | ICD-10-CM

## 2023-11-04 NOTE — Telephone Encounter (Signed)
 Requested Prescriptions  Pending Prescriptions Disp Refills   sertraline (ZOLOFT) 100 MG tablet [Pharmacy Med Name: SERTRALINE HCL TABS 100MG ] 90 tablet 1    Sig: TAKE 1 TABLET DAILY     Psychiatry:  Antidepressants - SSRI - sertraline Failed - 11/04/2023  9:54 AM      Failed - Valid encounter within last 6 months    Recent Outpatient Visits           6 months ago Mixed hyperlipidemia   [place] [facility] [place], Salvadore Oxford, NP   1 year ago Encounter for general adult medical examination with abnormal findings   [place] [facility] [place], Salvadore Oxford, NP   1 year ago Acute right-sided low back pain with right-sided sciatica   [place] [facility] [place], Salvadore Oxford, NP   2 years ago Acute cough   [place] [facility] [place], [place] W, NP   2 years ago Aortic atherosclerosis [facility])   [place] [facility] [place], [place] W, NP              Passed - AST in normal range and within 360 days    AST  Date Value Ref Range Status  04/22/2023 14 10 - 35 U/L Final         Passed - ALT in normal range and within 360 days    ALT  Date Value Ref Range Status  04/22/2023 16 6 - 29 U/L Final         Passed - Completed PHQ-2 or PHQ-9 in the last 360 days       montelukast (SINGULAIR) 10 MG tablet [Pharmacy Med Name: MONTELUKAST SODIUM TABS 10MG ] 90 tablet 3    Sig: TAKE 1 TABLET AT BEDTIME     Pulmonology:  Leukotriene Inhibitors Passed - 11/04/2023  9:54 AM      Passed - Valid encounter within last 12 months    Recent Outpatient Visits           6 months ago Mixed hyperlipidemia   [place] [facility] [place], Salvadore Oxford, NP   1 year ago Encounter for general adult medical examination with abnormal findings   [place] [facility] [place], [place] W, NP   1 year ago Acute right-sided low back pain with right-sided sciatica   [place] [facility]  [place], Salvadore Oxford, NP   2 years ago Acute cough   [place] [facility] [place], Salvadore Oxford, NP   2 years ago Aortic atherosclerosis [facility])    [facility] [place], Salvadore Oxford, [place]

## 2023-11-25 ENCOUNTER — Encounter: Payer: Self-pay | Admitting: Internal Medicine

## 2023-11-25 LAB — COMPREHENSIVE METABOLIC PANEL WITH GFR
ALT: 18 IU/L (ref 0–32)
AST: 19 IU/L (ref 0–40)
Albumin: 4.2 g/dL (ref 3.9–4.9)
Alkaline Phosphatase: 70 IU/L (ref 44–121)
BUN/Creatinine Ratio: 17 (ref 12–28)
BUN: 14 mg/dL (ref 8–27)
Bilirubin Total: 0.6 mg/dL (ref 0.0–1.2)
CO2: 21 mmol/L (ref 20–29)
Calcium: 9.6 mg/dL (ref 8.7–10.3)
Chloride: 105 mmol/L (ref 96–106)
Creatinine, Ser: 0.81 mg/dL (ref 0.57–1.00)
Globulin, Total: 2.2 g/dL (ref 1.5–4.5)
Glucose: 97 mg/dL (ref 70–99)
Potassium: 4.6 mmol/L (ref 3.5–5.2)
Sodium: 141 mmol/L (ref 134–144)
Total Protein: 6.4 g/dL (ref 6.0–8.5)
eGFR: 82 mL/min/{1.73_m2} (ref >59)

## 2023-11-25 LAB — CBC
Hematocrit: 42.4 % (ref 34.0–46.6)
Hemoglobin: 14.4 g/dL (ref 11.1–15.9)
MCH: 32.2 pg (ref 26.6–33.0)
MCHC: 34 g/dL (ref 31.5–35.7)
MCV: 95 fL (ref 79–97)
Platelets: 470 10*3/uL — ABNORMAL HIGH (ref 150–450)
RBC: 4.47 x10E6/uL (ref 3.77–5.28)
RDW: 12.8 % (ref 11.7–15.4)
WBC: 7.4 10*3/uL (ref 3.4–10.8)

## 2023-11-25 LAB — LIPID PANEL
Chol/HDL Ratio: 2.7 ratio (ref 0.0–4.4)
Cholesterol, Total: 142 mg/dL (ref 100–199)
HDL: 53 mg/dL (ref >39)
LDL Chol Calc (NIH): 71 mg/dL (ref 0–99)
Triglycerides: 99 mg/dL (ref 0–149)
VLDL Cholesterol Cal: 18 mg/dL (ref 5–40)

## 2023-11-25 LAB — HEMOGLOBIN A1C
Est. average glucose Bld gHb Est-mCnc: 123 mg/dL
Hgb A1c MFr Bld: 5.9 % — ABNORMAL HIGH (ref 4.8–5.6)

## 2023-12-08 ENCOUNTER — Encounter: Payer: Self-pay | Admitting: Family Medicine

## 2023-12-08 ENCOUNTER — Ambulatory Visit: Admitting: Family Medicine

## 2023-12-08 VITALS — BP 110/72 | HR 78 | Ht 68.0 in | Wt 190.1 lb

## 2023-12-08 DIAGNOSIS — M7551 Bursitis of right shoulder: Secondary | ICD-10-CM

## 2023-12-08 DIAGNOSIS — M7521 Bicipital tendinitis, right shoulder: Secondary | ICD-10-CM

## 2023-12-08 MED ORDER — PREDNISONE 20 MG PO TABS: ORAL_TABLET | ORAL | 0 refills | Status: DC

## 2023-12-08 MED ORDER — NAPROXEN 500 MG PO TABS: 500.0000 mg | ORAL_TABLET | Freq: Two times a day (BID) | ORAL | 0 refills | Status: DC

## 2023-12-08 MED ORDER — CYCLOBENZAPRINE HCL 10 MG PO TABS
10.0000 mg | ORAL_TABLET | Freq: Every evening | ORAL | 0 refills | Status: DC | PRN
Start: 2023-12-08 — End: 2024-06-21

## 2023-12-08 NOTE — Progress Notes (Signed)
 Subjective:    Patient ID: Kelsey Key, female    DOB: [birth year], 63 y.o.   MRN: [id number]  Kelsey Key is a 63 y.o. female presenting on 12/08/2023 for Shoulder Pain  PCP Helayne Lo, FNP   HPI  Discussed the use of AI scribe software for clinical note transcription with the patient, who gave verbal consent to proceed.  History of Present Illness   Kelsey Key is a 63 year old female who presents with right shoulder pain.  Right shoulder pain began approximately four and a half weeks ago. Initially mild, the pain occurred when raising the arm and was attributed to possible muscle strain from housework. The pain has progressively worsened, especially when reaching away from the body or twisting the arm.  She does sleep on R side, frequent awakening overnight She is typing frequently for job, not lifting  About a week and a half ago, she consulted her massage therapist, who advised seeing a physician if the pain did not improve. Since then, the pain has intensified, affecting her ability to perform movements such as lifting her arm up or outwards and rotating it. The pain radiates from the shoulder down to the bicep region.  No trauma or major injury to the shoulder. Her daily activities include typing for work, which does not involve heavy lifting. She has been taking ibuprofen 400 mg at night and occasionally uses Flexeril , which is 64 years old, to aid sleep as she sleeps on the affected side. No medication is taken during the day.  R handed patient        12/08/2023    8:41 AM 10/20/2023    8:17 AM 04/22/2023    1:54 PM  Depression screen PHQ 2/9  Decreased Interest 0 2 0  Down, Depressed, Hopeless 0 2 0  PHQ - 2 Score 0 4 0  Altered sleeping 1 1   Tired, decreased energy 1 2   Change in appetite 0 0   Feeling bad or failure about yourself  0 0   Trouble concentrating 0 0   Moving slowly or fidgety/restless 0 0   Suicidal thoughts 0 0   PHQ-9 Score 2 7    Difficult doing work/chores Not difficult at all Somewhat difficult        12/08/2023    8:41 AM 10/20/2023    8:17 AM 04/22/2023    1:54 PM 05/17/2022    3:44 PM  GAD 7 : Generalized Anxiety Score  Nervous, Anxious, on Edge 1 2 1  0  Control/stop worrying 1 1 0 0  Worry too much - different things 1 1 0 0  Trouble relaxing 1 1 0 0  Restless 0 0 0 0  Easily annoyed or irritable 1 2 0 0  Afraid - awful might happen 0 0 0 0  Total GAD 7 Score 5 7 1  0  Anxiety Difficulty Somewhat difficult Somewhat difficult Not difficult at all Not difficult at all    Social History   Tobacco Use   Smoking status: Former    Current packs/day: 0.50    Average packs/day: 0.5 packs/day for 30.0 years (15.0 ttl pk-yrs)    Types: Cigarettes   Smokeless tobacco: Never  Vaping Use   Vaping status: Never Used  Substance Use Topics   Alcohol use: No   Drug use: No    Review of Systems Per HPI unless specifically indicated above     Objective:    BP 110/72 (BP  Location: Left Arm, Patient Position: Sitting, Cuff Size: Normal)   Pulse 78   Ht 5\' 8"  (1.727 m)   Wt 190 lb 2 oz (86.2 kg)   SpO2 99%   BMI 28.91 kg/m   Wt Readings from Last 3 Encounters:  12/08/23 190 lb 2 oz (86.2 kg)  10/20/23 190 lb (86.2 kg)  04/22/23 193 lb (87.5 kg)    Physical Exam Vitals and nursing note reviewed.  Constitutional:      General: She is not in acute distress.    Appearance: Normal appearance. She is well-developed. She is not diaphoretic.     Comments: Well-appearing, comfortable, cooperative  HENT:     Head: Normocephalic and atraumatic.  Eyes:     General:        Right eye: No discharge.        Left eye: No discharge.     Conjunctiva/sclera: Conjunctivae normal.  Cardiovascular:     Rate and Rhythm: Normal rate.  Pulmonary:     Effort: Pulmonary effort is normal.  Musculoskeletal:     Comments: RIGHT Shoulder Inspection: Normal appearance bilateral symmetrical Palpation: Mild tender to  anterior biceps region. Rest of shoulder non tender. No biceps tendon bulge. ROM: Mild reduced forward flexion above head level and reduced abduction. Intact internal rotation behind back. Special Testing: Rotator cuff testing negative for weakness with supraspinatus full can and empty can test, Impingement positive for pain - Positive pain with biceps flexion and supination/pronation Strength: Normal strength 5/5 flex/ext, ext rot / int rot, grip, rotator cuff str testing. Neurovascular: Distally intact pulses, sensation to light touch   Skin:    General: Skin is warm and dry.     Findings: No erythema or rash.  Neurological:     Mental Status: She is alert and oriented to person, place, and time.  Psychiatric:        Mood and Affect: Mood normal.        Behavior: Behavior normal.        Thought Content: Thought content normal.     Comments: Well groomed, good eye contact, normal speech and thoughts     Results for orders placed or performed in visit on 10/20/23  HM MAMMOGRAPHY   Collection Time: 07/21/23 12:00 AM  Result Value Ref Range   HM Mammogram 0-4 Bi-Rad 0-4 Bi-Rad, Self Reported Normal  CBC   Collection Time: 11/24/23  8:29 AM  Result Value Ref Range   WBC 7.4 3.4 - 10.8 x10E3/uL   RBC 4.47 3.77 - 5.28 x10E6/uL   Hemoglobin 14.4 11.1 - 15.9 g/dL   Hematocrit 09.8 11.9 - 46.6 %   MCV 95 79 - 97 fL   MCH 32.2 26.6 - 33.0 pg   MCHC 34.0 31.5 - 35.7 g/dL   RDW 14.7 82.9 - 56.2 %   Platelets 470 (H) 150 - 450 x10E3/uL  Lipid panel   Collection Time: 11/24/23  8:29 AM  Result Value Ref Range   Cholesterol, Total 142 100 - 199 mg/dL   Triglycerides 99 0 - 149 mg/dL   HDL 53 >13 mg/dL   VLDL Cholesterol Cal 18 5 - 40 mg/dL   LDL Chol Calc (NIH) 71 0 - 99 mg/dL   Chol/HDL Ratio 2.7 0.0 - 4.4 ratio  Hemoglobin A1c   Collection Time: 11/24/23  8:29 AM  Result Value Ref Range   Hgb A1c MFr Bld 5.9 (H) 4.8 - 5.6 %   Est. average glucose Bld gHb Est-mCnc 123  mg/dL   Comprehensive metabolic panel   Collection Time: 11/24/23  8:29 AM  Result Value Ref Range   Glucose 97 70 - 99 mg/dL   BUN 14 8 - 27 mg/dL   Creatinine, Ser 4.40 0.57 - 1.00 mg/dL   eGFR 82 >34 VQ/QVZ/5.63   BUN/Creatinine Ratio 17 12 - 28   Sodium 141 134 - 144 mmol/L   Potassium 4.6 3.5 - 5.2 mmol/L   Chloride 105 96 - 106 mmol/L   CO2 21 20 - 29 mmol/L   Calcium  9.6 8.7 - 10.3 mg/dL   Total Protein 6.4 6.0 - 8.5 g/dL   Albumin 4.2 3.9 - 4.9 g/dL   Globulin, Total 2.2 1.5 - 4.5 g/dL   Bilirubin Total 0.6 0.0 - 1.2 mg/dL   Alkaline Phosphatase 70 44 - 121 IU/L   AST 19 0 - 40 IU/L   ALT 18 0 - 32 IU/L      Assessment & Plan:   Problem List Items Addressed This Visit   None Visit Diagnoses       Bursitis of right shoulder    -  Primary   Relevant Medications   predniSONE  (DELTASONE ) 20 MG tablet   cyclobenzaprine  (FLEXERIL ) 10 MG tablet   naproxen  (NAPROSYN ) 500 MG tablet     Biceps tendonitis on right       Relevant Medications   predniSONE  (DELTASONE ) 20 MG tablet   cyclobenzaprine  (FLEXERIL ) 10 MG tablet   naproxen  (NAPROSYN ) 500 MG tablet        RIGHT Biceps Tendonitis and Shoulder Bursitis Right shoulder pain due to biceps tendonitis and shoulder bursitis, likely from overuse. Strong rotator cuff muscles suggest no significant tear. No clear traumatic or isolated injury. Seems to be chronic gradual overuse most likely. Prefers to avoid cortisone injection.  - Prescribed 7-day tapering course of prednisone , starting at 20 mg daily. - Hold ibuprofen during steroid course. - Prescribed naproxen  500 mg twice daily post-prednisone . - Renewed Flexeril  10 mg for nighttime use, 30 tablets. - Advised rest and normal activity without overuse. - Consider Xray imaging or referral if no improvement post-medication.  - Next steps involved referral to Physical Therapy and or Sports Med/Ortho in future if unsuccessful      No orders of the defined types were placed in  this encounter.   Meds ordered this encounter  Medications   predniSONE  (DELTASONE ) 20 MG tablet    Sig: Take daily with food. Start with 60mg  (3 pills) x 2 days, then reduce to 40mg  (2 pills) x 2 days, then 20mg  (1 pill) x 3 days    Dispense:  13 tablet    Refill:  0   cyclobenzaprine  (FLEXERIL ) 10 MG tablet    Sig: Take 1 tablet (10 mg total) by mouth at bedtime as needed for muscle spasms.    Dispense:  30 tablet    Refill:  0   naproxen  (NAPROSYN ) 500 MG tablet    Sig: Take 1 tablet (500 mg total) by mouth 2 (two) times daily with a meal. For 1-2 weeks then as needed    Dispense:  60 tablet    Refill:  0    Follow up plan: Return if symptoms worsen or fail to improve.   Domingo Friend, DO [facility] 12/08/2023, 8:52 AM

## 2023-12-08 NOTE — Patient Instructions (Addendum)
 Thank you for coming to the office today.  Most likely you have bursitis of your shoulder. This is inflammation of the shoulder joint caused most often by arthritis or wear and tear. Often it can flare up to cause bursitis due to repetitive activities or other triggers. It may take time to heal, possibly 2 to 6 weeks, and it is important to avoid over use of shoulder especially above head motions that can re-aggravate the problem.  Start Prednisone  Hold ibuprofen naproxen  Then start Naproxen  when finished-  Recommend trial of Anti-inflammatory with Naproxen  (Naprosyn ) 500mg  tabs - take one with food and plenty of water TWICE daily every day (breakfast and dinner), for next 1-2 weeks, then you may take only as needed  - DO NOT TAKE any ibuprofen, aleve , motrin while you are taking this medicine  Recommend to start taking Tylenol Extra Strength 500mg  tabs - take 1 to 2 tabs per dose (max 1000mg ) every 6-8 hours for pain (take regularly, don't skip a dose for next 7 days), max 24 hour daily dose is 6 tablets or 3000mg . In the future you can repeat the same everyday Tylenol course for 1-2 weeks at a time.   Future X-ray orders if interested  Also we can refer you to Physical Therapy if just need to improve on range of motion.  Lastly if not improved by about 4-6 weeks - or just need treatment sooner - call and return for a Steroid Injection in shoulder.   Please schedule a Follow-up Appointment to: Return if symptoms worsen or fail to improve.  If you have any other questions or concerns, please feel free to call the office or send a message through MyChart. You may also schedule an earlier appointment if necessary.  Additionally, you may be receiving a survey about your experience at our office within a few days to 1 week by e-mail or mail. We value your feedback.  Domingo Friend, DO [facility], [place]

## 2024-04-20 ENCOUNTER — Ambulatory Visit: Admitting: Internal Medicine

## 2024-04-20 ENCOUNTER — Encounter: Payer: Self-pay | Admitting: Internal Medicine

## 2024-04-20 VITALS — BP 124/74 | Ht 68.0 in | Wt 198.6 lb

## 2024-04-20 DIAGNOSIS — Z1231 Encounter for screening mammogram for malignant neoplasm of breast: Secondary | ICD-10-CM

## 2024-04-20 DIAGNOSIS — R7303 Prediabetes: Secondary | ICD-10-CM | POA: Diagnosis not present

## 2024-04-20 DIAGNOSIS — Z780 Asymptomatic menopausal state: Secondary | ICD-10-CM

## 2024-04-20 DIAGNOSIS — F418 Other specified anxiety disorders: Secondary | ICD-10-CM

## 2024-04-20 DIAGNOSIS — E66811 Obesity, class 1: Secondary | ICD-10-CM

## 2024-04-20 DIAGNOSIS — F5101 Primary insomnia: Secondary | ICD-10-CM

## 2024-04-20 DIAGNOSIS — Z853 Personal history of malignant neoplasm of breast: Secondary | ICD-10-CM

## 2024-04-20 DIAGNOSIS — I700 Atherosclerosis of aorta: Secondary | ICD-10-CM

## 2024-04-20 DIAGNOSIS — D75839 Thrombocytosis, unspecified: Secondary | ICD-10-CM

## 2024-04-20 DIAGNOSIS — E782 Mixed hyperlipidemia: Secondary | ICD-10-CM | POA: Diagnosis not present

## 2024-04-20 DIAGNOSIS — G4700 Insomnia, unspecified: Secondary | ICD-10-CM | POA: Insufficient documentation

## 2024-04-20 DIAGNOSIS — F410 Panic disorder [episodic paroxysmal anxiety] without agoraphobia: Secondary | ICD-10-CM

## 2024-04-20 DIAGNOSIS — M8589 Other specified disorders of bone density and structure, multiple sites: Secondary | ICD-10-CM

## 2024-04-20 MED ORDER — SERTRALINE HCL 100 MG PO TABS: 100.0000 mg | ORAL_TABLET | Freq: Every day | ORAL | 1 refills | Status: AC

## 2024-04-20 NOTE — Patient Instructions (Signed)

## 2024-04-20 NOTE — Assessment & Plan Note (Signed)
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 

## 2024-04-20 NOTE — Assessment & Plan Note (Signed)
 C-Met and lipid profile today Encouraged her to consume a low-fat diet Continue atorvastatin  40 mg, fish oil 1200 mg and aspirin  81 mg daily

## 2024-04-20 NOTE — Assessment & Plan Note (Signed)
 A1c today Encourage low-carb diet and exercise for weight loss

## 2024-04-20 NOTE — Assessment & Plan Note (Signed)
 Continue sertraline  100 mg daily Continue Xanax  0.25 mg daily as needed as needed CSA today

## 2024-04-20 NOTE — Assessment & Plan Note (Signed)
 She will try melatonin 3 to 10 mg OTC Will monitor

## 2024-04-20 NOTE — Assessment & Plan Note (Signed)
 Continue calcium and vitamin D OTC Encouraged daily weightbearing exercise Bone density ordered

## 2024-04-20 NOTE — Progress Notes (Signed)
 Subjective:    Patient ID: Kelsey Key, female    DOB: 09-18-1960, 63 y.o.   MRN: [id number]  HPI  Patient presents to clinic today for follow-up of chronic conditions.  HLD with aortic atherosclerosis: Her last LDL was 71, triglycerides 99, 11/2023.  She denies myalgias on atorvastatin  and fish oil. She is taking aspirin  as well. She does not consume low-fat diet.  Osteopenia: She is taking calcium  and vitamin D OTC.  She does not get 30 minutes of weightbearing exercise daily.  Bone density from 04/2017 reviewed.  Anxiety with panic disorder: Chronic, managed on sertraline  and xanax . She is not currently seeing a therapist.  She denies depression, SI/HI.  History of breast cancer: In remission, status post lumpectomy, chemo and radiation.  She follows with [facility] oncology.  Prediabetes: Her last A1c was 5.9%, 11/2023.  She is not taking any oral diabetic medication at this time.  She does not check her sugars.    Thrombocytosis: Her last platelet count was 470, 11/2023.  She does not follow with hematology.  Insomnia: She is able to fall asleep but is unable to stay asleep.  She is not taking any medications for this.  Review of Systems     Past Medical History:  Diagnosis Date   Anxiety    Hyperlipidemia    Osteopenia 03/27/2017    Current Outpatient Medications  Medication Sig Dispense Refill   ALPRAZolam  (XANAX ) 0.25 MG tablet Take 1 tablet (0.25 mg total) by mouth daily as needed for anxiety. 20 tablet 0   aspirin  EC 81 MG tablet Take 1 tablet (81 mg total) by mouth daily. Swallow whole. 30 tablet 12   atorvastatin  (LIPITOR) 40 MG tablet TAKE 1 TABLET DAILY 90 tablet 3   calcium -vitamin D (OSCAL WITH D) 250-125 MG-UNIT tablet Take 1 tablet by mouth daily.     cyclobenzaprine  (FLEXERIL ) 10 MG tablet Take 1 tablet (10 mg total) by mouth at bedtime as needed for muscle spasms. 30 tablet 0   fluticasone  (FLONASE ) 50 MCG/ACT nasal spray USE 2 SPRAYS IN EACH NOSTRIL DAILY 48 g 3    montelukast  (SINGULAIR ) 10 MG tablet TAKE 1 TABLET AT BEDTIME 90 tablet 3   Multiple Vitamin (MULTIVITAMIN) capsule Take by mouth.     naproxen  (NAPROSYN ) 500 MG tablet Take 1 tablet (500 mg total) by mouth 2 (two) times daily with a meal. For 1-2 weeks then as needed 60 tablet 0   Omega-3 Fatty Acids (FISH OIL) 1200 MG CAPS Take by mouth.     predniSONE  (DELTASONE ) 20 MG tablet Take daily with food. Start with 60mg  (3 pills) x 2 days, then reduce to 40mg  (2 pills) x 2 days, then 20mg  (1 pill) x 3 days 13 tablet 0   Probiotic CAPS      sertraline  (ZOLOFT ) 100 MG tablet TAKE 1 TABLET DAILY 90 tablet 1   TURMERIC PO Take by mouth.     vitamin B-12 (CYANOCOBALAMIN) 100 MCG tablet Take 100 mcg by mouth daily.     vitamin E 400 UNIT capsule Take 400 Units by mouth daily.     No current facility-administered medications for this visit.    Allergies  Allergen Reactions   Chlorhexidine Rash    Patient developed rash after surgery. She thinks it is related to surgical prep.     Family History  Problem Relation Age of Onset   Diabetes Mother    Osteoporosis Mother    Diabetes Father    Hypertension  Brother    Throat cancer Maternal Grandmother    Healthy Brother    Heart attack Neg Hx    Stroke Neg Hx    Breast cancer Neg Hx    Colon cancer Neg Hx    Ovarian cancer Neg Hx     Social History   Socioeconomic History   Marital status: Married    Spouse name: Not on file   Number of children: Not on file   Years of education: Not on file   Highest education level: GED or equivalent  Occupational History   Not on file  Tobacco Use   Smoking status: Former    Current packs/day: 0.50    Average packs/day: 0.5 packs/day for 30.0 years (15.0 ttl pk-yrs)    Types: Cigarettes   Smokeless tobacco: Never  Vaping Use   Vaping status: Never Used  Substance and Sexual Activity   Alcohol use: No   Drug use: No   Sexual activity: Yes    Birth control/protection: None    Comment:  mutually monogamous long-term relationship  Other Topics Concern   Not on file  Social History Narrative   Not on file   Social Drivers of Health   Financial Resource Strain: Low Risk  (12/07/2023)   Overall Financial Resource Strain (CARDIA)    Difficulty of Paying Living Expenses: Not very hard  Food Insecurity: No Food Insecurity (12/07/2023)   Hunger Vital Sign    Worried About Running Out of Food in the Last Year: Never true    Ran Out of Food in the Last Year: Never true  Transportation Needs: No Transportation Needs (12/07/2023)   PRAPARE - Administrator, Civil Service (Medical): No    Lack of Transportation (Non-Medical): No  Physical Activity: Insufficiently Active (12/07/2023)   Exercise Vital Sign    Days of Exercise per Week: 3 days    Minutes of Exercise per Session: 20 min  Stress: Stress Concern Present (12/07/2023)   [employer] of Occupational Health - Occupational Stress Questionnaire    Feeling of Stress : To some extent  Social Connections: Unknown (12/07/2023)   Social Connection and Isolation Panel    Frequency of Communication with Friends and Family: More than three times a week    Frequency of Social Gatherings with Friends and Family: Once a week    Attends Religious Services: Never    Diplomatic Services operational officer: Not on file    Attends Banker Meetings: Not on file    Marital Status: Married  Catering manager Violence: Not on file     Constitutional: Denies fever, malaise, fatigue, headache or abrupt weight changes.  HEENT: Denies eye pain, eye redness, ear pain, ringing in the ears, wax buildup, runny nose, nasal congestion, bloody nose, or sore throat. Respiratory: Denies difficulty breathing, shortness of breath, cough or sputum production.   Cardiovascular: Denies chest pain, chest tightness, palpitations or swelling in the hands or feet.  Gastrointestinal: Denies abdominal pain, bloating, constipation,  diarrhea or blood in the stool.  GU: Denies urgency, frequency, pain with urination, burning sensation, blood in urine, odor or discharge. Musculoskeletal: Denies decrease in range of motion, difficulty with gait, muscle pain or joint pain and swelling.  Skin: Denies redness, rashes, lesions or ulcercations.  Neurological: Patient reports paresthesia of feet, insomnia.  Denies dizziness, difficulty with memory, difficulty with speech or problems with balance and coordination.  Psych: Patient has a history of anxiety.  Denies depression, SI/HI.  No other specific complaints in a complete review of systems (except as listed in HPI above).  Objective:   Physical Exam BP 124/74 (BP Location: Left Arm, Patient Position: Sitting, Cuff Size: Normal)   Ht 5' 8 (1.727 m)   Wt 198 lb 9.6 oz (90.1 kg)   BMI 30.20 kg/m    Wt Readings from Last 3 Encounters:  12/08/23 190 lb 2 oz (86.2 kg)  10/20/23 190 lb (86.2 kg)  04/22/23 193 lb (87.5 kg)    General: Appears her stated age, obese, in NAD. Skin: Warm, dry and intact. HEENT: Head: normal shape and size; Eyes: sclera white, no icterus, conjunctiva pink, PERRLA and EOMs intact;  Cardiovascular: Normal rate and rhythm. S1,S2 noted.  No murmur, rubs or gallops noted. No JVD or BLE edema. No carotid bruits noted. Pulmonary/Chest: Normal effort and positive vesicular breath sounds. No respiratory distress. No wheezes, rales or ronchi noted.  Musculoskeletal: No difficulty with gait.  Neurological: Alert and oriented. Coordination normal.  Psychiatric: Mood and affect normal. Behavior is normal. Judgment and thought content normal.    BMET    Component Value Date/Time   NA 141 11/24/2023 0829   K 4.6 11/24/2023 0829   CL 105 11/24/2023 0829   CO2 21 11/24/2023 0829   GLUCOSE 97 11/24/2023 0829   GLUCOSE 95 04/22/2023 1355   BUN 14 11/24/2023 0829   CREATININE 0.81 11/24/2023 0829   CREATININE 0.74 04/22/2023 1355   CALCIUM  9.6 11/24/2023  0829   GFRNONAA 77 02/25/2020 0815   GFRAA 89 02/25/2020 0815    Lipid Panel     Component Value Date/Time   CHOL 142 11/24/2023 0829   TRIG 99 11/24/2023 0829   HDL 53 11/24/2023 0829   CHOLHDL 2.7 11/24/2023 0829   CHOLHDL 2.6 04/22/2023 1355   LDLCALC 71 11/24/2023 0829   LDLCALC 57 04/22/2023 1355    CBC    Component Value Date/Time   WBC 7.4 11/24/2023 0829   WBC 8.0 04/22/2023 1355   RBC 4.47 11/24/2023 0829   RBC 4.46 04/22/2023 1355   HGB 14.4 11/24/2023 0829   HCT 42.4 11/24/2023 0829   PLT 470 (H) 11/24/2023 0829   MCV 95 11/24/2023 0829   MCH 32.2 11/24/2023 0829   MCH 31.8 04/22/2023 1355   MCHC 34.0 11/24/2023 0829   MCHC 33.8 04/22/2023 1355   RDW 12.8 11/24/2023 0829   LYMPHSABS 3.7 (H) 02/25/2020 0815   EOSABS 0.1 02/25/2020 0815   BASOSABS 0.1 02/25/2020 0815    Hgb A1C Lab Results  Component Value Date   HGBA1C 5.9 (H) 11/24/2023            Assessment & Plan:     RTC in 6 months for your annual exam Angeline Laura, NP

## 2024-04-20 NOTE — Assessment & Plan Note (Signed)
 Encouraged diet and exercise for weight loss ?

## 2024-04-20 NOTE — Assessment & Plan Note (Signed)
CBC today Encourage smoking cessation

## 2024-04-20 NOTE — Assessment & Plan Note (Signed)
 C-Met and lipid profile today Encouraged her to consume a low-fat diet Continue atorvastatin  40 mg and fish oil 1200 mg daily

## 2024-04-21 ENCOUNTER — Encounter: Payer: Self-pay | Admitting: Internal Medicine

## 2024-04-21 DIAGNOSIS — R7303 Prediabetes: Secondary | ICD-10-CM

## 2024-04-21 DIAGNOSIS — E782 Mixed hyperlipidemia: Secondary | ICD-10-CM

## 2024-04-21 DIAGNOSIS — Z0001 Encounter for general adult medical examination with abnormal findings: Secondary | ICD-10-CM

## 2024-04-21 NOTE — Telephone Encounter (Signed)
 Labs ordered for [facility] and placed on Amber's desk

## 2024-05-12 ENCOUNTER — Ambulatory Visit: Payer: Self-pay | Admitting: Internal Medicine

## 2024-05-12 LAB — CBC
Hematocrit: 43.9 % (ref 34.0–46.6)
Hemoglobin: 14.4 g/dL (ref 11.1–15.9)
MCH: 31.8 pg (ref 26.6–33.0)
MCHC: 32.8 g/dL (ref 31.5–35.7)
MCV: 97 fL (ref 79–97)
Platelets: 438 x10E3/uL (ref 150–450)
RBC: 4.53 x10E6/uL (ref 3.77–5.28)
RDW: 12.6 % (ref 11.7–15.4)
WBC: 7.6 x10E3/uL (ref 3.4–10.8)

## 2024-05-12 LAB — COMPREHENSIVE METABOLIC PANEL WITH GFR
ALT: 19 IU/L (ref 0–32)
AST: 18 IU/L (ref 0–40)
Albumin: 4.3 g/dL (ref 3.9–4.9)
Alkaline Phosphatase: 71 IU/L (ref 49–135)
BUN/Creatinine Ratio: 14 (ref 12–28)
BUN: 12 mg/dL (ref 8–27)
Bilirubin Total: 0.6 mg/dL (ref 0.0–1.2)
CO2: 21 mmol/L (ref 20–29)
Calcium: 9.6 mg/dL (ref 8.7–10.3)
Chloride: 105 mmol/L (ref 96–106)
Creatinine, Ser: 0.83 mg/dL (ref 0.57–1.00)
Globulin, Total: 2.1 g/dL (ref 1.5–4.5)
Glucose: 113 mg/dL — ABNORMAL HIGH (ref 70–99)
Potassium: 4.7 mmol/L (ref 3.5–5.2)
Sodium: 142 mmol/L (ref 134–144)
Total Protein: 6.4 g/dL (ref 6.0–8.5)
eGFR: 79 mL/min/1.73 (ref >59)

## 2024-05-12 LAB — HEMOGLOBIN A1C
Est. average glucose Bld gHb Est-mCnc: 123 mg/dL
Hgb A1c MFr Bld: 5.9 % — ABNORMAL HIGH (ref 4.8–5.6)

## 2024-05-12 LAB — LIPID PANEL
Chol/HDL Ratio: 2.9 ratio (ref 0.0–4.4)
Cholesterol, Total: 146 mg/dL (ref 100–199)
HDL: 50 mg/dL (ref >39)
LDL Chol Calc (NIH): 77 mg/dL (ref 0–99)
Triglycerides: 102 mg/dL (ref 0–149)
VLDL Cholesterol Cal: 19 mg/dL (ref 5–40)

## 2024-06-11 ENCOUNTER — Encounter: Payer: Self-pay | Admitting: Internal Medicine

## 2024-06-21 ENCOUNTER — Encounter: Payer: Self-pay | Admitting: Ophthalmology

## 2024-06-21 NOTE — Discharge Instructions (Signed)

## 2024-06-23 ENCOUNTER — Encounter: Payer: Self-pay | Admitting: General Practice

## 2024-06-23 ENCOUNTER — Other Ambulatory Visit: Payer: Self-pay

## 2024-06-23 ENCOUNTER — Ambulatory Visit
Admission: RE | Admit: 2024-06-23 | Discharge: 2024-06-23 | Disposition: A | Discharge status: Home / Self Care | Attending: Ophthalmology | Admitting: Ophthalmology

## 2024-06-23 ENCOUNTER — Encounter
Admission: RE | Disposition: A | Discharge status: Home / Self Care | Payer: Self-pay | Source: Home / Self Care | Attending: Ophthalmology

## 2024-06-23 ENCOUNTER — Encounter: Payer: Self-pay | Admitting: Ophthalmology

## 2024-06-23 ENCOUNTER — Ambulatory Visit: Payer: Self-pay | Admitting: General Practice

## 2024-06-23 DIAGNOSIS — H2511 Age-related nuclear cataract, right eye: Secondary | ICD-10-CM | POA: Insufficient documentation

## 2024-06-23 DIAGNOSIS — R7303 Prediabetes: Secondary | ICD-10-CM | POA: Diagnosis not present

## 2024-06-23 DIAGNOSIS — F17210 Nicotine dependence, cigarettes, uncomplicated: Secondary | ICD-10-CM | POA: Diagnosis not present

## 2024-06-23 HISTORY — PX: CATARACT EXTRACTION W/PHACO: SHX586

## 2024-06-23 HISTORY — DX: Prediabetes: R73.03

## 2024-06-23 SURGERY — PHACOEMULSIFICATION, CATARACT, WITH IOL INSERTION
Anesthesia: Monitor Anesthesia Care | Site: Eye | Laterality: Right

## 2024-06-23 MED ORDER — FENTANYL CITRATE (PF) 100 MCG/2ML IJ SOLN
INTRAMUSCULAR | Status: DC | PRN
  Administered 2024-06-23 (×2): 50 ug via INTRAVENOUS

## 2024-06-23 MED ORDER — TETRACAINE HCL 0.5 % OP SOLN
1.0000 [drp] | OPHTHALMIC | Status: DC | PRN
  Administered 2024-06-23 (×3): 1 [drp] via OPHTHALMIC

## 2024-06-23 MED ORDER — SIGHTPATH DOSE#1 BSS IO SOLN
INTRAOCULAR | Status: DC | PRN
  Administered 2024-06-23: 15 mL via INTRAOCULAR

## 2024-06-23 MED ORDER — MOXIFLOXACIN HCL 0.5 % OP SOLN
OPHTHALMIC | Status: DC | PRN
  Administered 2024-06-23: .2 mL via OPHTHALMIC

## 2024-06-23 MED ORDER — PHENYLEPHRINE HCL 10 % OP SOLN
1.0000 [drp] | OPHTHALMIC | Status: AC
  Administered 2024-06-23 (×3): 1 [drp] via OPHTHALMIC

## 2024-06-23 MED ORDER — SIGHTPATH DOSE#1 NA HYALUR & NA CHOND-NA HYALUR IO KIT
PACK | INTRAOCULAR | Status: DC | PRN
  Administered 2024-06-23: 1 via OPHTHALMIC

## 2024-06-23 MED ORDER — LIDOCAINE HCL (PF) 2 % IJ SOLN
INTRAOCULAR | Status: DC | PRN
  Administered 2024-06-23: 4 mL via INTRAOCULAR

## 2024-06-23 MED ORDER — FENTANYL CITRATE (PF) 100 MCG/2ML IJ SOLN
INTRAMUSCULAR | Status: AC
  Filled 2024-06-23: qty 2

## 2024-06-23 MED ORDER — CYCLOPENTOLATE HCL 2 % OP SOLN
1.0000 [drp] | OPHTHALMIC | Status: AC
  Administered 2024-06-23 (×3): 1 [drp] via OPHTHALMIC

## 2024-06-23 MED ORDER — MIDAZOLAM HCL (PF) 2 MG/2ML IJ SOLN
INTRAMUSCULAR | Status: DC | PRN
  Administered 2024-06-23 (×2): 1 mg via INTRAVENOUS

## 2024-06-23 MED ORDER — MIDAZOLAM HCL 2 MG/2ML IJ SOLN
INTRAMUSCULAR | Status: AC
  Filled 2024-06-23: qty 2

## 2024-06-23 MED ORDER — BRIMONIDINE TARTRATE-TIMOLOL 0.2-0.5 % OP SOLN
OPHTHALMIC | Status: DC | PRN
  Administered 2024-06-23: 1 [drp] via OPHTHALMIC

## 2024-06-23 MED ORDER — TETRACAINE HCL 0.5 % OP SOLN
OPHTHALMIC | Status: AC
Start: 2024-06-23 — End: 2024-06-23
  Filled 2024-06-23: qty 4

## 2024-06-23 MED ORDER — PHENYLEPHRINE HCL 10 % OP SOLN
OPHTHALMIC | Status: AC
  Filled 2024-06-23: qty 5

## 2024-06-23 MED ORDER — CYCLOPENTOLATE HCL 2 % OP SOLN
OPHTHALMIC | Status: AC
  Filled 2024-06-23: qty 2

## 2024-06-23 MED ORDER — SIGHTPATH DOSE#1 BSS IO SOLN
INTRAOCULAR | Status: DC | PRN
  Administered 2024-06-23: 109 mL via OPHTHALMIC

## 2024-06-23 SURGICAL SUPPLY — 8 items
FEE CATARACT SUITE SIGHTPATH (MISCELLANEOUS) ×1 IMPLANT
GLOVE BIOGEL PI IND STRL 8 (GLOVE) ×1 IMPLANT
GLOVE SURG LX STRL 7.5 STRW (GLOVE) ×1 IMPLANT
GLOVE SURG SYN 6.5 PF PI BL (GLOVE) ×1 IMPLANT
LENS IOL CLRN PANO TRC 4 16.0 IMPLANT
NDL FILTER BLUNT 18X1 1/2 (NEEDLE) ×1 IMPLANT
NEEDLE FILTER BLUNT 18X1 1/2 (NEEDLE) ×1 IMPLANT
SYR 3ML LL SCALE MARK (SYRINGE) ×1 IMPLANT

## 2024-06-23 NOTE — H&P (Signed)
 [facility]   Primary Care Physician:  Antonette Angeline ORN, NP Ophthalmologist: Dr. Curtistine Fava  Pre-Procedure History & Physical: HPI:  Kelsey Key is a 63 y.o. female here for cataract surgery.   Past Medical History:  Diagnosis Date   Allergy    Seasonal Allergies   Anxiety    Cancer (HCC) 07/2020   Breast Cancer   Hyperlipidemia    Osteopenia 03/27/2017   Pre-diabetes     Past Surgical History:  Procedure Laterality Date   ABDOMINAL HYSTERECTOMY  1991   for endometriosis - no cervical ca   BREAST LUMPECTOMY Right 2022   CHOLECYSTECTOMY  2003   Oophrectomy   1997   SPLENECTOMY, TOTAL      Prior to Admission medications   Medication Sig Start Date End Date Taking? Authorizing Provider  albuterol  (VENTOLIN  HFA) 108 (90 Base) MCG/ACT inhaler Inhale into the lungs as needed. 08/15/21  Yes [provider]  aspirin  EC 81 MG tablet Take 1 tablet (81 mg total) by mouth daily. Swallow whole. 05/17/22  Yes Antonette Angeline ORN, NP  atorvastatin  (LIPITOR) 40 MG tablet TAKE 1 TABLET DAILY 07/01/23  Yes Baity, Angeline ORN, NP  calcium -vitamin D (OSCAL WITH D) 250-125 MG-UNIT tablet Take 1 tablet by mouth daily.   Yes [provider]  fluticasone  (FLONASE ) 50 MCG/ACT nasal spray USE 2 SPRAYS IN EACH NOSTRIL DAILY 09/24/22  Yes Baity, Angeline ORN, NP  levocetirizine (XYZAL) 5 MG tablet Take 5 mg by mouth. 05/27/23  Yes [provider]  Misc Natural Products (ELDERBERRY ZINC/VIT C/IMMUNE) LOZG Take 1 tablet by mouth every morning.   Yes [provider]  MISC NATURAL PRODUCTS PO Take 1 tablet by mouth daily as needed (FIBER GUMMY).   Yes [provider]  montelukast  (SINGULAIR ) 10 MG tablet TAKE 1 TABLET AT BEDTIME 11/04/23  Yes Baity, Angeline ORN, NP  Multiple Vitamin (MULTIVITAMIN) capsule Take by mouth.   Yes [provider]  Omega-3 Fatty Acids (FISH OIL) 1200 MG CAPS Take by mouth.   Yes [provider]  Probiotic CAPS  12/07/14   Yes [provider]  sertraline  (ZOLOFT ) 100 MG tablet Take 1 tablet (100 mg total) by mouth daily. 04/20/24  Yes Antonette Angeline ORN, NP  TURMERIC PO Take by mouth.   Yes [provider]  vitamin B-12 (CYANOCOBALAMIN) 100 MCG tablet Take 100 mcg by mouth daily.   Yes [provider]  vitamin E 400 UNIT capsule Take 400 Units by mouth daily.   Yes [provider]  ALPRAZolam  (XANAX ) 0.25 MG tablet Take 1 tablet (0.25 mg total) by mouth daily as needed for anxiety. 10/20/23   Antonette Angeline ORN, NP    Allergies as of 06/09/2024 - Review Complete 04/20/2024  Allergen Reaction Noted   Chlorhexidine Rash 08/28/2020    Family History  Problem Relation Age of Onset   Diabetes Mother    Osteoporosis Mother    COPD Mother    Hearing loss Mother    Diabetes Father    Hypertension Brother    Throat cancer Maternal Grandmother    Healthy Brother    Heart attack Neg Hx    Stroke Neg Hx    Breast cancer Neg Hx    Colon cancer Neg Hx    Ovarian cancer Neg Hx     Social History   Socioeconomic History   Marital status: Married    Spouse name: Not on file   Number of children: Not on  file   Years of education: Not on file   Highest education level: GED or equivalent  Occupational History   Not on file  Tobacco Use   Smoking status: Every Day    Current packs/day: 0.50    Average packs/day: 0.5 packs/day for 30.0 years (15.0 ttl pk-yrs)    Types: Cigarettes   Smokeless tobacco: Never  Vaping Use   Vaping status: Never Used  Substance and Sexual Activity   Alcohol use: No   Drug use: Yes    Types: Marijuana    Comment: as a teenager   Sexual activity: Yes    Birth control/protection: None    Comment: mutually monogamous long-term relationship  Other Topics Concern   Not on file  Social History Narrative   Not on file   Social Drivers of Health   Financial Resource Strain: Low Risk  (12/07/2023)   Overall Financial Resource Strain (CARDIA)     Difficulty of Paying Living Expenses: Not very hard  Food Insecurity: No Food Insecurity (12/07/2023)   Hunger Vital Sign    Worried About Running Out of Food in the Last Year: Never true    Ran Out of Food in the Last Year: Never true  Transportation Needs: No Transportation Needs (12/07/2023)   PRAPARE - Administrator, Civil Service (Medical): No    Lack of Transportation (Non-Medical): No  Physical Activity: Insufficiently Active (12/07/2023)   Exercise Vital Sign    Days of Exercise per Week: 3 days    Minutes of Exercise per Session: 20 min  Stress: Stress Concern Present (12/07/2023)   [employer] of Occupational Health - Occupational Stress Questionnaire    Feeling of Stress : To some extent  Social Connections: Unknown (12/07/2023)   Social Connection and Isolation Panel    Frequency of Communication with Friends and Family: More than three times a week    Frequency of Social Gatherings with Friends and Family: Once a week    Attends Religious Services: Never    Diplomatic Services Operational Officer: Not on file    Attends Engineer, Structural: Not on file    Marital Status: Married  Catering Manager Violence: Not on file    Review of Systems: See HPI, otherwise negative ROS  Physical Exam: BP 136/68   Pulse 75   Temp 97.9 F (36.6 C) (Temporal)   Resp 17   Ht 5' 8 (1.727 m)   Wt 90.3 kg   SpO2 97%   BMI 30.26 kg/m  General:   Alert, cooperative in NAD Head:  Normocephalic and atraumatic. Respiratory:  Normal work of breathing. Cardiovascular:  RRR  Impression/Plan: Kelsey Key is here for cataract surgery.  Risks, benefits, limitations, and alternatives regarding cataract surgery have been reviewed with the patient.  Questions have been answered.  All parties agreeable.   Curtistine JINNY Fava, MD  06/23/2024, 1:16 PM

## 2024-06-23 NOTE — Anesthesia Postprocedure Evaluation (Signed)
 Anesthesia Post Note  Patient: Kelsey Key  Procedure(s) Performed: PHACOEMULSIFICATION, CATARACT, WITH IOL INSERTION 11.95 01:19.2 (Right: Eye)  Patient location during evaluation: PACU Anesthesia Type: MAC Level of consciousness: awake and alert Pain management: pain level controlled Vital Signs Assessment: post-procedure vital signs reviewed and stable Respiratory status: spontaneous breathing, nonlabored ventilation, respiratory function stable and patient connected to nasal cannula oxygen Cardiovascular status: stable and blood pressure returned to baseline Postop Assessment: no apparent nausea or vomiting Anesthetic complications: no   No notable events documented.   Last Vitals:  Vitals:   06/23/24 1054 06/23/24 1400  BP: 136/68 105/73  Pulse: 75 62  Resp: 17 (!) 7  Temp: 36.6 C 36.7 C  SpO2: 97% 98%    Last Pain:  Vitals:   06/23/24 1400  TempSrc:   PainSc: 0-No pain                 Prentice Murphy

## 2024-06-23 NOTE — Op Note (Signed)
 PREOPERATIVE DIAGNOSIS:  Nuclear sclerotic cataract of the right eye.   POSTOPERATIVE DIAGNOSIS:  Right Eye Cataract   OPERATIVE PROCEDURE:ORPROCALL@   SURGEON:  Curtistine Fava, MD   ANESTHESIA:  Anesthesiologist: Dario Barter, MD CRNA: Myra Lawless, CRNA; Liliane Ports, CRNA  Monitored anesthesia care. Topical tetracaine drops followed by 2% Xylocaine jelly applied in the preoperative holding area 0.39ml of epi-Shugarcaine was instilled in the eye following the paracentesis.   COMPLICATIONS:  None.   TECHNIQUE:   Phacoemulsification divide and conquer   DESCRIPTION OF PROCEDURE:  The patient was examined and consented in the preoperative holding area where the aforementioned topical anesthesia was applied to the right eye and then brought back to the Operating Room where the right eye was prepped and draped in the usual sterile ophthalmic fashion and a lid speculum was placed. A paracentesis was created with the side port blade and the anterior chamber was filled with epi-Shugarcaine follower by viscoelastic. A clear corneal incision was performed with the steel keratome. A continuous curvilinear capsulorrhexis was performed with a cystotome followed by the capsulorrhexis forceps. Hydrodissection and hydrodelineation were carried out with BSS on a blunt cannula. The lens was removed in a divide and conquer technique and the remaining cortical material was removed with the irrigation-aspiration handpiece. The capsular bag was inflated with viscoelastic and the PXCAT4 +16.0 lens was placed in the capsular bag without complication. The remaining viscoelastic was removed from the eye with the irrigation-aspiration handpiece. The wounds were hydrated. The anterior chamber was flushed with BSS and the eye was inflated to physiologic pressure. 0.1ml of Vigamox was placed in the anterior chamber. The wounds were found to be water tight. The eye was dressed with Combigan and covered with a clear  shield to be worn until the first postoperative day appointment. The patient was given protective glasses to wear throughout the day. The patient was also given drops with which to begin a drop regimen today and will follow-up with me in one day. Implant Name Type Inv. Item Serial No. Manufacturer Lot No. LRB No. Used Action  LENS IOL CLRN PANO TRC 4 16.0 - [id number]  LENS IOL CLRN PANO TRC 4 16.0 [id number] [employer]  Right 1 Implanted   Procedure(s): PHACOEMULSIFICATION, CATARACT, WITH IOL INSERTION 11.95 01:19.2 (Right)  Electronically signed: Curtistine PARAS [facility] 06/23/2024 1:58 PM

## 2024-06-23 NOTE — Anesthesia Preprocedure Evaluation (Signed)
 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:   Pulmonary neg shortness of breath, neg sleep apnea, neg COPD, neg recent URI, Current Smoker   Pulmonary exam normal breath sounds clear to auscultation       Cardiovascular Exercise Tolerance: Good negative cardio ROS Normal cardiovascular exam Rhythm:regular Rate:Normal     Neuro/Psych  PSYCHIATRIC DISORDERS Anxiety     negative neurological ROS     GI/Hepatic negative GI ROS, Neg liver ROS,,,  Endo/Other  diabetes (borderline)    Renal/GU negative Renal ROS  negative genitourinary   Musculoskeletal   Abdominal   Peds  Hematology negative hematology ROS (+)   Anesthesia Other Findings Past Medical History: No date: Allergy     Comment:  Seasonal Allergies No date: Anxiety 07/2020: Cancer (HCC)     Comment:  Breast Cancer No date: Hyperlipidemia 03/27/2017: Osteopenia No date: Pre-diabetes   Reproductive/Obstetrics negative OB ROS                              Anesthesia Physical Anesthesia Plan  ASA: 2  Anesthesia Plan: MAC   Post-op Pain Management:    Induction: Intravenous  PONV Risk Score and Plan: 2 and Midazolam and Treatment may vary due to age or medical condition  Airway Management Planned: Natural Airway and Nasal Cannula  Additional Equipment:   Intra-op Plan:   Post-operative Plan:   Informed Consent: I have reviewed the patients History and Physical, chart, labs and discussed  the procedure including the risks, benefits and alternatives for the proposed anesthesia with the patient or authorized representative who has indicated his/her understanding and acceptance.     Dental Advisory Given  Plan Discussed with: Anesthesiologist, CRNA and Surgeon  Anesthesia Plan Comments:          Anesthesia Quick Evaluation

## 2024-06-23 NOTE — Transfer of Care (Signed)
 Immediate Anesthesia Transfer of Care Note  Patient: Kelsey Key  Procedure(s) Performed: PHACOEMULSIFICATION, CATARACT, WITH IOL INSERTION 11.95 01:19.2 (Right: Eye)  Patient Location: PACU  Anesthesia Type: MAC  Level of Consciousness: awake, alert  and patient cooperative  Airway and Oxygen Therapy: Patient Spontanous Breathing and Patient connected to supplemental oxygen  Post-op Assessment: Post-op Vital signs reviewed, Patient's Cardiovascular Status Stable, Respiratory Function Stable, Patent Airway and No signs of Nausea or vomiting  Post-op Vital Signs: Reviewed and stable  Complications: No notable events documented.

## 2024-06-24 ENCOUNTER — Encounter: Payer: Self-pay | Admitting: Ophthalmology

## 2024-07-02 NOTE — Anesthesia Preprocedure Evaluation (Addendum)
 Anesthesia Evaluation  Patient identified by MRN, date of birth, ID band Patient awake    Reviewed: Allergy & Precautions, H&P , NPO status , Patient's Chart, lab work & pertinent test results  Airway Mallampati: III  TM Distance: >3 FB Neck ROM: Full    Dental no notable dental hx.  Permanent bridge on top right::   Pulmonary Current Smoker   Pulmonary exam normal breath sounds clear to auscultation       Cardiovascular negative cardio ROS Normal cardiovascular exam Rhythm:Regular Rate:Normal     Neuro/Psych  PSYCHIATRIC DISORDERS Anxiety     negative neurological ROS  negative psych ROS   GI/Hepatic negative GI ROS, Neg liver ROS,,,  Endo/Other  negative endocrine ROS    Renal/GU negative Renal ROS  negative genitourinary   Musculoskeletal negative musculoskeletal ROS (+)    Abdominal   Peds negative pediatric ROS (+)  Hematology negative hematology ROS (+)   Anesthesia Other Findings Previous cataract surgery 06-23-24 Dr. Dario and Sari Clause, CRNA, had versed 2 mg IV and fentanyl 100 mcg IV intra-op  Patient is concerned; last time she had cataract surgery, she vividly remembers having sharp pain, not just pressure, toward the end of the case, and would like to avoid that feeling this time.   Anxiety Hyperlipidemia Osteopenia Cancer (HCC) Allergy Pre-diabetes    Reproductive/Obstetrics negative OB ROS                              Anesthesia Physical Anesthesia Plan  ASA: 2  Anesthesia Plan: MAC   Post-op Pain Management:    Induction: Intravenous  PONV Risk Score and Plan:   Airway Management Planned: Natural Airway and Nasal Cannula  Additional Equipment:   Intra-op Plan:   Post-operative Plan:   Informed Consent: I have reviewed the patients History and Physical, chart, labs and discussed the procedure including the risks, benefits and alternatives for the  proposed anesthesia with the patient or authorized representative who has indicated his/her understanding and acceptance.     Dental Advisory Given  Plan Discussed with: Anesthesiologist, CRNA and Surgeon  Anesthesia Plan Comments: (Patient consented for risks of anesthesia including but not limited to:  - adverse reactions to medications - damage to eyes, teeth, lips or other oral mucosa - nerve damage due to positioning  - sore throat or hoarseness - Damage to heart, brain, nerves, lungs, other parts of body or loss of life  Patient voiced understanding and assent.)         Anesthesia Quick Evaluation

## 2024-07-04 ENCOUNTER — Other Ambulatory Visit: Payer: Self-pay | Admitting: Internal Medicine

## 2024-07-04 DIAGNOSIS — E782 Mixed hyperlipidemia: Secondary | ICD-10-CM

## 2024-07-06 NOTE — Telephone Encounter (Signed)
 Requested Prescriptions  Pending Prescriptions Disp Refills   atorvastatin  (LIPITOR) 40 MG tablet [Pharmacy Med Name: ATORVASTATIN  TABS 40MG ] 90 tablet 0    Sig: TAKE 1 TABLET DAILY     Cardiovascular:  Antilipid - Statins Failed - 07/06/2024  2:12 PM      Failed - Lipid Panel in normal range within the last 12 months    Cholesterol, Total  Date Value Ref Range Status  05/11/2024 146 100 - 199 mg/dL Final   LDL Cholesterol (Calc)  Date Value Ref Range Status  04/22/2023 57 mg/dL (calc) Final    Comment:    Reference range: <100 . Desirable range <100 mg/dL for primary prevention;   <70 mg/dL for patients with CHD or diabetic patients  with > or = 2 CHD risk factors. SABRA LDL-C is now calculated using the Martin-Hopkins  calculation, which is a validated novel method providing  better accuracy than the Friedewald equation in the  estimation of LDL-C.  Gladis APPLETHWAITE et al. SANDREA. 7986;689(80): 2061-2068  (http://education.[facility].com/faq/FAQ164)    LDL Chol Calc (NIH)  Date Value Ref Range Status  05/11/2024 77 0 - 99 mg/dL Final   HDL  Date Value Ref Range Status  05/11/2024 50 >39 mg/dL Final   Triglycerides  Date Value Ref Range Status  05/11/2024 102 0 - 149 mg/dL Final         Passed - Patient is not pregnant      Passed - Valid encounter within last 12 months    Recent Outpatient Visits           2 months ago Encounter for screening mammogram for breast cancer   [place] [facility] [place], Angeline ORN, NP   7 months ago Bursitis of right shoulder   [place] [facility] [place], Marsa PARAS, DO   8 months ago Encounter for general adult medical examination with abnormal findings   [facility] [place], Angeline ORN, NP   9 months ago Acute non-recurrent frontal sinusitis   [place] [facility] [place], Marsa PARAS, [place]

## 2024-07-06 NOTE — Discharge Instructions (Signed)

## 2024-07-07 ENCOUNTER — Ambulatory Visit
Admission: RE | Admit: 2024-07-07 | Discharge: 2024-07-07 | Disposition: A | Discharge status: Home / Self Care | Attending: Ophthalmology | Admitting: Ophthalmology

## 2024-07-07 ENCOUNTER — Encounter: Payer: Self-pay | Admitting: Ophthalmology

## 2024-07-07 ENCOUNTER — Other Ambulatory Visit: Payer: Self-pay

## 2024-07-07 ENCOUNTER — Encounter
Admission: RE | Disposition: A | Discharge status: Home / Self Care | Payer: Self-pay | Source: Home / Self Care | Attending: Ophthalmology

## 2024-07-07 ENCOUNTER — Ambulatory Visit: Payer: Self-pay | Admitting: Anesthesiology

## 2024-07-07 DIAGNOSIS — F17210 Nicotine dependence, cigarettes, uncomplicated: Secondary | ICD-10-CM | POA: Insufficient documentation

## 2024-07-07 DIAGNOSIS — I700 Atherosclerosis of aorta: Secondary | ICD-10-CM

## 2024-07-07 DIAGNOSIS — H2512 Age-related nuclear cataract, left eye: Secondary | ICD-10-CM | POA: Insufficient documentation

## 2024-07-07 DIAGNOSIS — F419 Anxiety disorder, unspecified: Secondary | ICD-10-CM | POA: Insufficient documentation

## 2024-07-07 HISTORY — PX: CATARACT EXTRACTION W/PHACO: SHX586

## 2024-07-07 SURGERY — PHACOEMULSIFICATION, CATARACT, WITH IOL INSERTION
Anesthesia: Monitor Anesthesia Care | Laterality: Left

## 2024-07-07 MED ORDER — PHENYLEPHRINE HCL 10 % OP SOLN
OPHTHALMIC | Status: AC
  Filled 2024-07-07: qty 5

## 2024-07-07 MED ORDER — SIGHTPATH DOSE#1 BSS IO SOLN
INTRAOCULAR | Status: DC | PRN
  Administered 2024-07-07: 96 mL via OPHTHALMIC

## 2024-07-07 MED ORDER — PHENYLEPHRINE HCL 10 % OP SOLN
1.0000 [drp] | OPHTHALMIC | Status: AC
  Administered 2024-07-07 (×3): 1 [drp] via OPHTHALMIC

## 2024-07-07 MED ORDER — SIGHTPATH DOSE#1 BSS IO SOLN
INTRAOCULAR | Status: DC | PRN
  Administered 2024-07-07: 15 mL via INTRAOCULAR

## 2024-07-07 MED ORDER — MOXIFLOXACIN HCL 0.5 % OP SOLN
OPHTHALMIC | Status: DC | PRN
  Administered 2024-07-07: .2 mL via OPHTHALMIC

## 2024-07-07 MED ORDER — TETRACAINE HCL 0.5 % OP SOLN
1.0000 [drp] | OPHTHALMIC | Status: DC | PRN
  Administered 2024-07-07 (×3): 1 [drp] via OPHTHALMIC

## 2024-07-07 MED ORDER — CYCLOPENTOLATE HCL 2 % OP SOLN
OPHTHALMIC | Status: AC
  Filled 2024-07-07: qty 2

## 2024-07-07 MED ORDER — MIDAZOLAM HCL 2 MG/2ML IJ SOLN
INTRAMUSCULAR | Status: AC
  Filled 2024-07-07: qty 2

## 2024-07-07 MED ORDER — BRIMONIDINE TARTRATE-TIMOLOL 0.2-0.5 % OP SOLN
OPHTHALMIC | Status: DC | PRN
  Administered 2024-07-07: 1 [drp] via OPHTHALMIC

## 2024-07-07 MED ORDER — MIDAZOLAM HCL (PF) 2 MG/2ML IJ SOLN
INTRAMUSCULAR | Status: DC | PRN
  Administered 2024-07-07 (×4): 1 mg via INTRAVENOUS

## 2024-07-07 MED ORDER — CYCLOPENTOLATE HCL 2 % OP SOLN
1.0000 [drp] | OPHTHALMIC | Status: AC
  Administered 2024-07-07 (×3): 1 [drp] via OPHTHALMIC

## 2024-07-07 MED ORDER — SIGHTPATH DOSE#1 NA HYALUR & NA CHOND-NA HYALUR IO KIT
PACK | INTRAOCULAR | Status: DC | PRN
  Administered 2024-07-07: 1 via OPHTHALMIC

## 2024-07-07 MED ORDER — FENTANYL CITRATE (PF) 100 MCG/2ML IJ SOLN
INTRAMUSCULAR | Status: AC
  Filled 2024-07-07: qty 2

## 2024-07-07 MED ORDER — TETRACAINE 0.5 % OP SOLN OPTIME - NO CHARGE
OPHTHALMIC | Status: DC | PRN
  Administered 2024-07-07: 1 [drp] via OPHTHALMIC

## 2024-07-07 MED ORDER — LACTATED RINGERS IV SOLN: INTRAVENOUS | Status: DC

## 2024-07-07 MED ORDER — TETRACAINE HCL 0.5 % OP SOLN
OPHTHALMIC | Status: AC
  Filled 2024-07-07: qty 4

## 2024-07-07 MED ORDER — FENTANYL CITRATE (PF) 100 MCG/2ML IJ SOLN
INTRAMUSCULAR | Status: DC | PRN
  Administered 2024-07-07 (×2): 50 ug via INTRAVENOUS

## 2024-07-07 MED ORDER — LIDOCAINE HCL (PF) 2 % IJ SOLN
INTRAOCULAR | Status: DC | PRN
  Administered 2024-07-07: 4 mL via INTRAOCULAR

## 2024-07-07 SURGICAL SUPPLY — 10 items
CANNULA HYDRODISSEC NUC 25X7/8 (MISCELLANEOUS) ×1 IMPLANT
DRSG TEGADERM 2-3/8X2-3/4 SM (GAUZE/BANDAGES/DRESSINGS) ×1 IMPLANT
FEE CATARACT SUITE SIGHTPATH (MISCELLANEOUS) ×1 IMPLANT
GLOVE BIOGEL PI IND STRL 8 (GLOVE) ×1 IMPLANT
GLOVE SURG LX STRL 7.5 STRW (GLOVE) ×1 IMPLANT
GLOVE SURG SYN 6.5 PF PI BL (GLOVE) ×1 IMPLANT
LENS IOL CLRN PAN PRO TRC 15.5 IMPLANT
NDL FILTER BLUNT 18X1 1/2 (NEEDLE) ×1 IMPLANT
NEEDLE FILTER BLUNT 18X1 1/2 (NEEDLE) ×1 IMPLANT
SYR 3ML LL SCALE MARK (SYRINGE) ×1 IMPLANT

## 2024-07-07 NOTE — Anesthesia Postprocedure Evaluation (Signed)
 Anesthesia Post Note  Patient: Kelsey Key  Procedure(s) Performed: PHACOEMULSIFICATION, CATARACT, WITH IOL INSERTION 10.46 01:16.8 (Left)  Patient location during evaluation: PACU Anesthesia Type: MAC Level of consciousness: awake and alert Pain management: pain level controlled Vital Signs Assessment: post-procedure vital signs reviewed and stable Respiratory status: spontaneous breathing, nonlabored ventilation, respiratory function stable and patient connected to nasal cannula oxygen Cardiovascular status: stable and blood pressure returned to baseline Postop Assessment: no apparent nausea or vomiting Anesthetic complications: no   No notable events documented.   Last Vitals:  Vitals:   07/07/24 1138 07/07/24 1143  BP: 116/70   Pulse:  67  Resp:  (!) 0  Temp: (!) 36.1 C (!) 36.1 C  SpO2:  94%    Last Pain:  Vitals:   07/07/24 1143  TempSrc:   PainSc: 0-No pain                 Kelsey Key

## 2024-07-07 NOTE — H&P (Signed)
 [facility]   Primary Care Physician:  Antonette Angeline ORN, NP Ophthalmologist: Dr. Curtistine Fava  Pre-Procedure History & Physical: HPI:  Kelsey Key is a 63 y.o. female here for cataract surgery.   Past Medical History:  Diagnosis Date   Allergy    Seasonal Allergies   Anxiety    Cancer (HCC) 07/2020   Breast Cancer   Hyperlipidemia    Osteopenia 03/27/2017   Pre-diabetes     Past Surgical History:  Procedure Laterality Date   ABDOMINAL HYSTERECTOMY  1991   for endometriosis - no cervical ca   BREAST LUMPECTOMY Right 2022   CATARACT EXTRACTION W/PHACO Right 06/23/2024   Procedure: PHACOEMULSIFICATION, CATARACT, WITH IOL INSERTION 11.95 01:19.2;  Surgeon: Mittie Gaskin, MD;  Location: [facility];  Service: Ophthalmology;  Laterality: Right;   CHOLECYSTECTOMY  2003   Oophrectomy   1997   SPLENECTOMY, TOTAL      Prior to Admission medications   Medication Sig Start Date End Date Taking? Authorizing Provider  albuterol  (VENTOLIN  HFA) 108 (90 Base) MCG/ACT inhaler Inhale into the lungs as needed. 08/15/21  Yes [provider]  ALPRAZolam  (XANAX ) 0.25 MG tablet Take 1 tablet (0.25 mg total) by mouth daily as needed for anxiety. 10/20/23  Yes Antonette Angeline ORN, NP  aspirin  EC 81 MG tablet Take 1 tablet (81 mg total) by mouth daily. Swallow whole. 05/17/22  Yes Antonette Angeline ORN, NP  atorvastatin  (LIPITOR) 40 MG tablet TAKE 1 TABLET DAILY 07/06/24  Yes Baity, Angeline ORN, NP  calcium -vitamin D (OSCAL WITH D) 250-125 MG-UNIT tablet Take 1 tablet by mouth daily.   Yes [provider]  fluticasone  (FLONASE ) 50 MCG/ACT nasal spray USE 2 SPRAYS IN EACH NOSTRIL DAILY 09/24/22  Yes Baity, Angeline ORN, NP  levocetirizine (XYZAL) 5 MG tablet Take 5 mg by mouth. 05/27/23  Yes [provider]  Misc Natural Products (ELDERBERRY ZINC/VIT C/IMMUNE) LOZG Take 1 tablet by mouth every morning.   Yes [provider]  MISC NATURAL PRODUCTS PO Take 1 tablet  by mouth daily as needed (FIBER GUMMY).   Yes [provider]  montelukast  (SINGULAIR ) 10 MG tablet TAKE 1 TABLET AT BEDTIME 11/04/23  Yes Baity, Angeline ORN, NP  Multiple Vitamin (MULTIVITAMIN) capsule Take by mouth.   Yes [provider]  Omega-3 Fatty Acids (FISH OIL) 1200 MG CAPS Take by mouth.   Yes [provider]  Probiotic CAPS  12/07/14  Yes [provider]  sertraline  (ZOLOFT ) 100 MG tablet Take 1 tablet (100 mg total) by mouth daily. 04/20/24  Yes Antonette Angeline ORN, NP  TURMERIC PO Take by mouth.   Yes [provider]  vitamin B-12 (CYANOCOBALAMIN) 100 MCG tablet Take 100 mcg by mouth daily.   Yes [provider]  vitamin E 400 UNIT capsule Take 400 Units by mouth daily.   Yes [provider]    Allergies as of 06/09/2024 - Review Complete 04/20/2024  Allergen Reaction Noted   Chlorhexidine Rash 08/28/2020    Family History  Problem Relation Age of Onset   Diabetes Mother    Osteoporosis Mother    COPD Mother    Hearing loss Mother    Diabetes Father    Hypertension Brother    Throat cancer Maternal Grandmother    Healthy Brother    Heart attack Neg Hx    Stroke Neg Hx    Breast cancer Neg Hx    Colon cancer Neg Hx    Ovarian  cancer Neg Hx     Social History   Socioeconomic History   Marital status: Married    Spouse name: Not on file   Number of children: Not on file   Years of education: Not on file   Highest education level: GED or equivalent  Occupational History   Not on file  Tobacco Use   Smoking status: Every Day    Current packs/day: 0.50    Average packs/day: 0.5 packs/day for 30.0 years (15.0 ttl pk-yrs)    Types: Cigarettes   Smokeless tobacco: Never  Vaping Use   Vaping status: Never Used  Substance and Sexual Activity   Alcohol use: No   Drug use: Yes    Types: Marijuana    Comment: as a teenager   Sexual activity: Yes    Birth control/protection: None    Comment: mutually  monogamous long-term relationship  Other Topics Concern   Not on file  Social History Narrative   Not on file   Social Drivers of Health   Financial Resource Strain: Low Risk  (12/07/2023)   Overall Financial Resource Strain (CARDIA)    Difficulty of Paying Living Expenses: Not very hard  Food Insecurity: No Food Insecurity (12/07/2023)   Hunger Vital Sign    Worried About Running Out of Food in the Last Year: Never true    Ran Out of Food in the Last Year: Never true  Transportation Needs: No Transportation Needs (12/07/2023)   PRAPARE - Administrator, Civil Service (Medical): No    Lack of Transportation (Non-Medical): No  Physical Activity: Insufficiently Active (12/07/2023)   Exercise Vital Sign    Days of Exercise per Week: 3 days    Minutes of Exercise per Session: 20 min  Stress: Stress Concern Present (12/07/2023)   [employer] of Occupational Health - Occupational Stress Questionnaire    Feeling of Stress : To some extent  Social Connections: Unknown (12/07/2023)   Social Connection and Isolation Panel    Frequency of Communication with Friends and Family: More than three times a week    Frequency of Social Gatherings with Friends and Family: Once a week    Attends Religious Services: Never    Diplomatic Services Operational Officer: Not on file    Attends Banker Meetings: Not on file    Marital Status: Married  Catering Manager Violence: Not on file    Review of Systems: See HPI, otherwise negative ROS  Physical Exam: BP 137/75   Pulse 78   Temp 97.6 F (36.4 C) (Temporal)   Ht 5' 8 (1.727 m)   Wt 88.9 kg   SpO2 98%   BMI 29.80 kg/m  General:   Alert, cooperative in NAD Head:  Normocephalic and atraumatic. Respiratory:  Normal work of breathing. Cardiovascular:  RRR  Impression/Plan: Kelsey Key is here for cataract surgery.  Risks, benefits, limitations, and alternatives regarding cataract surgery have been reviewed  with the patient.  Questions have been answered.  All parties agreeable.   Curtistine JINNY Fava, MD  07/07/2024, 10:53 AM

## 2024-07-07 NOTE — Transfer of Care (Signed)
 Immediate Anesthesia Transfer of Care Note  Patient: Kelsey Key  Procedure(s) Performed: PHACOEMULSIFICATION, CATARACT, WITH IOL INSERTION 10.46 01:16.8 (Left)  Patient Location: PACU  Anesthesia Type: MAC  Level of Consciousness: awake, alert  and patient cooperative  Airway and Oxygen Therapy: Patient Spontanous Breathing and Patient connected to supplemental oxygen  Post-op Assessment: Post-op Vital signs reviewed, Patient's Cardiovascular Status Stable, Respiratory Function Stable, Patent Airway and No signs of Nausea or vomiting  Post-op Vital Signs: Reviewed and stable  Complications: No notable events documented.

## 2024-07-07 NOTE — Op Note (Signed)
 PREOPERATIVE DIAGNOSIS:  Nuclear sclerotic cataract of the left eye.   POSTOPERATIVE DIAGNOSIS:  Nuclear sclerotic cataract of the left eye.   OPERATIVE PROCEDURE: Phacoemulsification with IOL Implant Left Eye   SURGEON:  Curtistine Fava, MD   ANESTHESIA:  Anesthesiologist: Ola Donny BROCKS, MD CRNA: Jahoo, Sonia, CRNA; Niki Manus SAUNDERS, CRNA  1.      Managed anesthesia care. 2.     0.75ml of epi-Shugarcaine was instilled following the paracentesis   COMPLICATIONS:  None.   TECHNIQUE:   Phacoemulsification divide and conquer   DESCRIPTION OF PROCEDURE:  The patient was examined and consented in the preoperative holding area where the aforementioned topical anesthesia was applied to the left eye and then brought back to the Operating Room where the left eye was prepped and draped in the usual sterile ophthalmic fashion and a lid speculum was placed. A paracentesis was created with the side port blade and the anterior chamber was filled with epi-Shugarcaine follower by viscoelastic. A clear corneal incision was performed with the steel keratome. A continuous curvilinear capsulorrhexis was performed with a cystotome followed by the capsulorrhexis forceps. Hydrodissection and hydrodelineation were carried out with BSS on a blunt cannula. The lens was removed in a divide and conquer technique and the remaining cortical material was removed with the irrigation-aspiration handpiece. The capsular bag was inflated with viscoelastic and the [id number] 15.5 IOL lens was placed in the capsular bag without complication at the 049 degree axis. The remaining viscoelastic was removed from the eye with the irrigation-aspiration handpiece. The wounds were hydrated. The anterior chamber was flushed with BSS and the eye was inflated to physiologic pressure. 0.1ml of Vigamox was placed in the anterior chamber. The wounds were found to be water tight. The eye was dressed with Combigan and covered with a clear shield to be  worn until the first postoperative day appointment. The patient was given protective glasses to wear throughout the day. The patient was also given drops with which to begin a drop regimen today and will follow-up with me in one day. Implant Name Type Inv. Item Serial No. Manufacturer Lot No. LRB No. Used Action  LENS IOL CLRN PAN PRO TRC 15.5 - [id number]  LENS IOL CLRN PAN PRO TRC 15.5 [id number] [employer]  Left 1 Implanted    Procedure(s): PHACOEMULSIFICATION, CATARACT, WITH IOL INSERTION 10.46 01:16.8 (Left)  Electronically signed: Curtistine PARAS [facility] 07/07/2024 11:36 AM

## 2024-08-13 ENCOUNTER — Encounter: Payer: Self-pay | Admitting: Internal Medicine

## 2024-08-13 ENCOUNTER — Telehealth: Admitting: Internal Medicine

## 2024-08-13 ENCOUNTER — Ambulatory Visit: Payer: Self-pay

## 2024-08-13 DIAGNOSIS — U071 COVID-19: Secondary | ICD-10-CM | POA: Diagnosis not present

## 2024-08-13 MED ORDER — NIRMATRELVIR/RITONAVIR (PAXLOVID)TABLET: 3.0000 | ORAL_TABLET | Freq: Two times a day (BID) | ORAL | 0 refills | Status: AC

## 2024-08-13 NOTE — Telephone Encounter (Signed)
 Scheduled an appointment.

## 2024-08-13 NOTE — Patient Instructions (Signed)
 COVID-19: What to Know COVID-19 is an infection caused by a virus called SARS-CoV-2. This type of virus is called a coronavirus. People with COVID-19 may: Have few to no symptoms. Have mild to moderate symptoms that affect their lungs and breathing. Get very sick. What are the causes?  COVID-19 is caused by a virus. This virus may be in the air as droplets or on surfaces. It can spread from an infected person when they cough, sneeze, speak, sing, or breathe. You may become infected if: You breathe in the infected droplets in the air. You touch an object that has the virus on it. What increases the risk? You are at risk of getting COVID-19 if you have been around someone with the infection. You may be more likely to get very sick if: You are 57 years old or older. You have certain medical conditions, such as: Heart disease. Diabetes. Long-term respiratory disease. Cancer. Pregnancy. You are immunocompromised. This means your body can't fight infections easily. You have a disability that makes it hard for you to move around, you have trouble moving, or you can't move at all. What are the signs or symptoms? People may have different symptoms from COVID-19. The symptoms can also be mild to very bad. They often show up in 5-6 days after being infected. But, they can take up to 14 days to appear. Common symptoms are: Cough. Feeling tired. New loss of taste or smell. Fever. Less common symptoms are: Sore throat. Headache. Body or muscle aches. Diarrhea. A skin rash or fingers or toes that are a different color than usual. Red or irritated eyes. Sometimes, COVID-19 does not cause symptoms. How is this diagnosed? COVID-19 can be diagnosed with tests done in the lab or at home. Fluid from your nose, mouth, or lungs will be used to check for the virus. How is this treated? Treatment for COVID-19 depends on how sick you are. Mild symptoms can be treated at home with rest, fluids, and  over-the-counter medicines. very bad symptoms may be treated in a hospital intensive care unit (ICU). If you have symptoms and are at risk of getting very sick, you may be given a medicine that fights viruses. This medicine is called an antiviral. How is this prevented? To protect yourself from COVID-19: Know your risk factors. Get vaccinated. If your body can't fight infections easily, talk to your provider about treatment to help prevent COVID-19. Stay at least about 3 feet (1 meter) away from other people. Wear mask that fits well when: You can't stay at a distance from people. You're in a place with not a lot of air flow. Try to be in open spaces with good air flow when you are in public. Wash your hands often or use an alcohol-based hand sanitizer. Cover your nose and mouth when you cough or sneeze. If you think you have COVID-19 or have been around someone who has it, stay home and away from other people as told by your provider or health officials. Where to find more information To learn more: Go to [employer]no Click Health Topics. Type COVID-19 in the search box. Go to VisitDestination.com.br Click Health Topics. Then click All Topics. Type COVID-19 in the search box. Get help right away if: You have trouble breathing or get short of breath. You have pain or pressure in your chest. You're feeling confused. These symptoms may be an emergency. Get help right away. Call 911. Do not wait to see if the symptoms will go away.  Do not drive yourself to the hospital. This information is not intended to replace advice given to you by your health care provider. Make sure you discuss any questions you have with your health care provider. Document Revised: 05/08/2023 Document Reviewed: 04/30/2023 Elsevier Patient Education  2025 [employer].

## 2024-08-13 NOTE — Progress Notes (Signed)
 Virtual Visit via Video Note  I connected with Kelsey Key on 08/13/2024 at  9:40 AM EST by a video enabled telemedicine application and verified that I am speaking with the correct person using two identifiers.  Location: Patient: Home Provider: Office  Person's participating in this video call: Angeline Laura, NP-C and Kelsey Key   I discussed the limitations of evaluation and management by telemedicine and the availability of in person appointments. The patient expressed understanding and agreed to proceed.  History of Present Illness:  Discussed the use of AI scribe software for clinical note transcription with the patient, who gave verbal consent to proceed.  Kelsey Key is a 63 year old female with asplenia who presents with symptoms of COVID-19.  Last night, she began experiencing chills, a severe headache, a mild sore throat, and a fever of 103F. This morning, she took a flu and COVID test, which returned positive for COVID-19.  She has a runny nose and nasal congestion with clear nasal discharge. No ear pain, cough, shortness of breath, nausea, vomiting, or diarrhea. She notes body aches, which she attributes to lack of sleep, but describes them as 'nothing major'.  She is currently managing her symptoms with oatmeal and plans to take ibuprofen.  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        Past Medical History:  Diagnosis Date   Allergy    Seasonal Allergies   Anxiety    Cancer (HCC) 07/2020   Breast Cancer   Hyperlipidemia    Osteopenia 03/27/2017   Pre-diabetes     Current Outpatient Medications  Medication Sig Dispense Refill   albuterol  (VENTOLIN  HFA) 108 (90 Base) MCG/ACT inhaler Inhale into the lungs as needed.     ALPRAZolam  (XANAX ) 0.25 MG tablet Take 1 tablet (0.25 mg total) by mouth daily as needed for anxiety. 20 tablet  0   aspirin  EC 81 MG tablet Take 1 tablet (81 mg total) by mouth daily. Swallow whole. 30 tablet 12   atorvastatin  (LIPITOR) 40 MG tablet TAKE 1 TABLET DAILY 90 tablet 0   calcium -vitamin D (OSCAL WITH D) 250-125 MG-UNIT tablet Take 1 tablet by mouth daily.     fluticasone  (FLONASE ) 50 MCG/ACT nasal spray USE 2 SPRAYS IN EACH NOSTRIL DAILY 48 g 3   levocetirizine (XYZAL) 5 MG tablet Take 5 mg by mouth.     Misc Natural Products (ELDERBERRY ZINC/VIT C/IMMUNE) LOZG Take 1 tablet by mouth every morning.     MISC NATURAL PRODUCTS PO Take 1 tablet by mouth daily as needed (FIBER GUMMY).     montelukast  (SINGULAIR ) 10 MG tablet TAKE 1 TABLET AT BEDTIME 90 tablet 3   Multiple Vitamin (MULTIVITAMIN) capsule Take by mouth.     Omega-3 Fatty Acids (FISH OIL) 1200 MG CAPS Take by mouth.     Probiotic CAPS      sertraline  (ZOLOFT ) 100 MG tablet Take 1 tablet (100 mg total) by mouth daily. 90 tablet 1   TURMERIC PO Take by mouth.     vitamin B-12 (CYANOCOBALAMIN) 100 MCG tablet Take 100 mcg by mouth daily.     vitamin E 400 UNIT capsule Take 400 Units by mouth daily.     No current facility-administered medications for this visit.    Allergies[1]  Family History  Problem Relation Age of Onset   Diabetes Mother    Osteoporosis Mother  COPD Mother    Hearing loss Mother    Diabetes Father    Hypertension Brother    Throat cancer Maternal Grandmother    Healthy Brother    Heart attack Neg Hx    Stroke Neg Hx    Breast cancer Neg Hx    Colon cancer Neg Hx    Ovarian cancer Neg Hx     Social History   Socioeconomic History   Marital status: Married    Spouse name: Not on file   Number of children: Not on file   Years of education: Not on file   Highest education level: GED or equivalent  Occupational History   Not on file  Tobacco Use   Smoking status: Every Day    Current packs/day: 0.50    Average packs/day: 0.5 packs/day for 30.0 years (15.0 ttl pk-yrs)    Types: Cigarettes    Smokeless tobacco: Never  Vaping Use   Vaping status: Never Used  Substance and Sexual Activity   Alcohol use: No   Drug use: Yes    Types: Marijuana    Comment: as a teenager   Sexual activity: Yes    Birth control/protection: None    Comment: mutually monogamous long-term relationship  Other Topics Concern   Not on file  Social History Narrative   Not on file   Social Drivers of Health   Tobacco Use: High Risk (07/07/2024)   Patient History    Smoking Tobacco Use: Every Day    Smokeless Tobacco Use: Never    Passive Exposure: Not on file  Financial Resource Strain: Low Risk (12/07/2023)   Overall Financial Resource Strain (CARDIA)    Difficulty of Paying Living Expenses: Not very hard  Food Insecurity: No Food Insecurity (12/07/2023)   Hunger Vital Sign    Worried About Running Out of Food in the Last Year: Never true    Ran Out of Food in the Last Year: Never true  Transportation Needs: No Transportation Needs (12/07/2023)   PRAPARE - Administrator, Civil Service (Medical): No    Lack of Transportation (Non-Medical): No  Physical Activity: Insufficiently Active (12/07/2023)   Exercise Vital Sign    Days of Exercise per Week: 3 days    Minutes of Exercise per Session: 20 min  Stress: Stress Concern Present (12/07/2023)   [employer] of Occupational Health - Occupational Stress Questionnaire    Feeling of Stress : To some extent  Social Connections: Unknown (12/07/2023)   Social Connection and Isolation Panel    Frequency of Communication with Friends and Family: More than three times a week    Frequency of Social Gatherings with Friends and Family: Once a week    Attends Religious Services: Never    Database Administrator or Organizations: Not on file    Attends Banker Meetings: Not on file    Marital Status: Married  Intimate Partner Violence: Not on file  Depression (PHQ2-9): Medium Risk (04/20/2024)   Depression (PHQ2-9)    PHQ-2 Score:  8  Alcohol Screen: Low Risk (05/17/2022)   Alcohol Screen    Last Alcohol Screening Score (AUDIT): 0  Housing: Low Risk (12/07/2023)   Housing Stability Vital Sign    Unable to Pay for Housing in the Last Year: No    Number of Times Moved in the Last Year: 0    Homeless in the Last Year: No  Utilities: Not on file  Health Literacy: Not on file  Constitutional: Pt reports fever, headache,and chills. Denies malaise, or abrupt weight changes.  HEENT: Pt reports runny nose, nasal congestion, sore throat. Denies eye pain, eye redness, ear pain, ringing in the ears, wax buildup or bloody nose. Respiratory: Denies difficulty breathing, shortness of breath, cough or sputum production.   Cardiovascular: Denies chest pain, chest tightness, palpitations or swelling in the hands or feet.  Gastrointestinal: Denies abdominal pain, bloating, constipation, diarrhea or blood in the stool.  Musculoskeletal: Denies decrease in range of motion, difficulty with gait, muscle pain or joint pain and swelling.  Skin: Denies redness, rashes, lesions or ulcercations.  Neurological: Patient reports insomnia.  Denies dizziness, difficulty with memory, difficulty with speech or problems with balance and coordination.    No other specific complaints in a complete review of systems (except as listed in HPI above).  Observations/Objective:   Wt Readings from Last 3 Encounters:  07/07/24 196 lb (88.9 kg)  06/23/24 199 lb (90.3 kg)  04/20/24 198 lb 9.6 oz (90.1 kg)    General: Appears her stated age, appears unwell but in NAD. HEENT: Head: normal shape and size; Nose: Congestion noted; Throat/Mouth: Hoarseness noted Pulmonary/Chest: Normal effort. No respiratory distress.  Neurological: Alert and oriented.   BMET    Component Value Date/Time   NA 142 05/11/2024 0834   K 4.7 05/11/2024 0834   CL 105 05/11/2024 0834   CO2 21 05/11/2024 0834   GLUCOSE 113 (H) 05/11/2024 0834   GLUCOSE 95 04/22/2023 1355    BUN 12 05/11/2024 0834   CREATININE 0.83 05/11/2024 0834   CREATININE 0.74 04/22/2023 1355   CALCIUM  9.6 05/11/2024 0834   GFRNONAA 77 02/25/2020 0815   GFRAA 89 02/25/2020 0815    Lipid Panel     Component Value Date/Time   CHOL 146 05/11/2024 0834   TRIG 102 05/11/2024 0834   HDL 50 05/11/2024 0834   CHOLHDL 2.9 05/11/2024 0834   CHOLHDL 2.6 04/22/2023 1355   LDLCALC 77 05/11/2024 0834   LDLCALC 57 04/22/2023 1355    CBC    Component Value Date/Time   WBC 7.6 05/11/2024 0834   WBC 8.0 04/22/2023 1355   RBC 4.53 05/11/2024 0834   RBC 4.46 04/22/2023 1355   HGB 14.4 05/11/2024 0834   HCT 43.9 05/11/2024 0834   PLT 438 05/11/2024 0834   MCV 97 05/11/2024 0834   MCH 31.8 05/11/2024 0834   MCH 31.8 04/22/2023 1355   MCHC 32.8 05/11/2024 0834   MCHC 33.8 04/22/2023 1355   RDW 12.6 05/11/2024 0834   LYMPHSABS 3.7 (H) 02/25/2020 0815   EOSABS 0.1 02/25/2020 0815   BASOSABS 0.1 02/25/2020 0815    Hgb A1C Lab Results  Component Value Date   HGBA1C 5.9 (H) 05/11/2024       Assessment and Plan:  Assessment and Plan    Acute COVID-19 infection Confirmed by positive test. Symptoms include chills, headache, sore throat, fever, rhinorrhea, and nasal congestion. Current COVID strains are less severe. Symptomatic care is primary. -Encouraged rest and fluids - Prescribed Paxlovid , although advised her that insurance may not cover this - Advised to hold atorvastatin  if Paxlovid  is taken. - Recommended ibuprofen and flonase  for symptomatic care. - Instructed to report if symptoms worsen.  RTC in 3 months for follow-up of chronic conditions   Follow Up Instructions:    I discussed the assessment and treatment plan with the patient. The patient was provided an opportunity to ask questions and all were answered. The patient agreed with the  plan and demonstrated an understanding of the instructions.   The patient was advised to call back or seek an in-person  evaluation if the symptoms worsen or if the condition fails to improve as anticipated.   Angeline Laura, NP     [1]  Allergies Allergen Reactions   Chlorhexidine Rash    Patient developed rash after surgery. She thinks it is related to surgical prep.

## 2024-08-13 NOTE — Telephone Encounter (Signed)
" °  FYI Only or Action Required?: Action required by provider: Medication Request.  Patient was last seen in primary care on 04/20/2024 by Antonette Angeline ORN, NP.  Called Nurse Triage reporting Covid Positive.  Symptoms began yesterday.  Interventions attempted: Nothing.  Symptoms are: gradually worsening.  Triage Disposition: Call PCP Within 24 Hours  Patient/caregiver understands and will follow disposition?: Yes  Copied from CRM [id number]. Topic: Clinical - Red Word Triage >> Aug 13, 2024  9:04 AM Leonette SQUIBB wrote: Red Word that prompted transfer to Nurse Triage: Pt tested positive for Covid and wants Paxlovid .   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 SYMPTOMS: What is your main symptom or concern? (e.g., cough, fever, shortness of breath, muscle aches)      Cough, chills, headache  2. ONSET: When did the symptoms start?       X last night  3. COUGH: Do you have a cough? If Yes, ask: How bad is the cough?          4. FEVER: Do you have a fever? If Yes, ask: What is your temperature, how was it measured, and when did it start?      Yes, Fever  5. BREATHING DIFFICULTY: Are you having any difficulty breathing? (e.g., normal; shortness of breath, wheezing, unable to speak)       Denies  6. BETTER-SAME-WORSE: Are you getting better, staying the same or getting worse compared to yesterday?  If getting worse, ask, In what way?      Worse since onset  7. OTHER SYMPTOMS: Pt denies fatigue, loss of smell or taste, muscle pain.  Pt reports sore throat        8. COVID-19 DIAGNOSIS: How do you know that you have COVID? (e.g.,  positive lab test or self-test, diagnosed by doctor or NP/PA, symptoms after exposure).      Home test resulted positive  9. COVID-19 EXPOSURE: Was there any known exposure to COVID before the symptoms began?         10. COVID-19 VACCINE: Have you had the COVID-19 vaccine? If Yes, ask: When did you last get it?        Has had vaccines, but not recent  11. HIGH RISK DISEASE: Do you have any chronic medical problems? (e.g., asthma, heart or lung disease, weak immune system, obesity, etc.)        Obesity    Pt reports COVID 19 Routing to clinic to request medication for pt per pt's request. Pt wanting Paxlovid  Pt agrees with plan of care, will call back for any worsening symptoms  Protocols used: COVID-19 - Diagnosed or Suspected-A-AH  "

## 2024-10-25 ENCOUNTER — Encounter: Admitting: Internal Medicine
